# Patient Record
Sex: Female | Born: 1970 | Race: White | Hispanic: No | Marital: Married | Smoking: Never smoker
Health system: Southern US, Community
[De-identification: ages and names within clinical notes are randomized; demographics above are authoritative.]

## PROBLEM LIST (undated history)

## (undated) DIAGNOSIS — D649 Anemia, unspecified: Secondary | ICD-10-CM

## (undated) DIAGNOSIS — N393 Stress incontinence (female) (male): Secondary | ICD-10-CM

## (undated) DIAGNOSIS — M8580 Other specified disorders of bone density and structure, unspecified site: Secondary | ICD-10-CM

## (undated) DIAGNOSIS — I8390 Asymptomatic varicose veins of unspecified lower extremity: Secondary | ICD-10-CM

## (undated) HISTORY — DX: Stress incontinence (female) (male): N39.3

## (undated) HISTORY — DX: Asymptomatic varicose veins of unspecified lower extremity: I83.90

## (undated) HISTORY — PX: APPENDECTOMY: SHX54

## (undated) HISTORY — DX: Anemia, unspecified: D64.9

## (undated) HISTORY — DX: Other specified disorders of bone density and structure, unspecified site: M85.80

---

## 1997-12-24 ENCOUNTER — Ambulatory Visit (HOSPITAL_COMMUNITY): Admission: RE | Admit: 1997-12-24 | Discharge: 1997-12-24 | Payer: Self-pay | Admitting: Obstetrics and Gynecology

## 1998-01-04 ENCOUNTER — Ambulatory Visit (HOSPITAL_COMMUNITY): Admission: RE | Admit: 1998-01-04 | Discharge: 1998-01-04 | Payer: Self-pay | Admitting: Obstetrics and Gynecology

## 1998-08-28 ENCOUNTER — Inpatient Hospital Stay (HOSPITAL_COMMUNITY): Admission: AD | Admit: 1998-08-28 | Discharge: 1998-08-28 | Payer: Self-pay | Admitting: Obstetrics and Gynecology

## 1998-08-29 ENCOUNTER — Inpatient Hospital Stay (HOSPITAL_COMMUNITY): Admission: AD | Admit: 1998-08-29 | Discharge: 1998-08-31 | Payer: Self-pay | Admitting: Obstetrics and Gynecology

## 1998-08-31 ENCOUNTER — Encounter (HOSPITAL_COMMUNITY): Admission: RE | Admit: 1998-08-31 | Discharge: 1998-11-29 | Payer: Self-pay | Admitting: Obstetrics and Gynecology

## 1998-10-03 ENCOUNTER — Other Ambulatory Visit: Admission: RE | Admit: 1998-10-03 | Discharge: 1998-10-03 | Payer: Self-pay | Admitting: Obstetrics and Gynecology

## 1999-08-09 ENCOUNTER — Other Ambulatory Visit: Admission: RE | Admit: 1999-08-09 | Discharge: 1999-08-09 | Payer: Self-pay | Admitting: Obstetrics and Gynecology

## 2000-03-12 ENCOUNTER — Inpatient Hospital Stay (HOSPITAL_COMMUNITY): Admission: AD | Admit: 2000-03-12 | Discharge: 2000-03-15 | Payer: Self-pay | Admitting: Obstetrics and Gynecology

## 2000-03-12 ENCOUNTER — Encounter (INDEPENDENT_AMBULATORY_CARE_PROVIDER_SITE_OTHER): Payer: Self-pay

## 2000-04-24 ENCOUNTER — Other Ambulatory Visit: Admission: RE | Admit: 2000-04-24 | Discharge: 2000-04-24 | Payer: Self-pay | Admitting: Obstetrics and Gynecology

## 2001-06-16 ENCOUNTER — Other Ambulatory Visit: Admission: RE | Admit: 2001-06-16 | Discharge: 2001-06-16 | Payer: Self-pay | Admitting: Obstetrics and Gynecology

## 2001-08-02 ENCOUNTER — Encounter: Payer: Self-pay | Admitting: Obstetrics and Gynecology

## 2001-08-02 ENCOUNTER — Inpatient Hospital Stay (HOSPITAL_COMMUNITY): Admission: AD | Admit: 2001-08-02 | Discharge: 2001-08-02 | Payer: Self-pay | Admitting: Obstetrics and Gynecology

## 2001-12-27 ENCOUNTER — Inpatient Hospital Stay (HOSPITAL_COMMUNITY): Admission: AD | Admit: 2001-12-27 | Discharge: 2001-12-30 | Payer: Self-pay | Admitting: Obstetrics and Gynecology

## 2001-12-27 ENCOUNTER — Encounter: Payer: Self-pay | Admitting: Obstetrics and Gynecology

## 2001-12-27 ENCOUNTER — Encounter (INDEPENDENT_AMBULATORY_CARE_PROVIDER_SITE_OTHER): Payer: Self-pay

## 2002-01-26 ENCOUNTER — Other Ambulatory Visit: Admission: RE | Admit: 2002-01-26 | Discharge: 2002-01-26 | Payer: Self-pay | Admitting: Obstetrics and Gynecology

## 2003-03-02 ENCOUNTER — Other Ambulatory Visit: Admission: RE | Admit: 2003-03-02 | Discharge: 2003-03-02 | Payer: Self-pay | Admitting: Obstetrics and Gynecology

## 2003-07-03 HISTORY — PX: CHOLECYSTECTOMY: SHX55

## 2003-10-06 ENCOUNTER — Inpatient Hospital Stay (HOSPITAL_COMMUNITY): Admission: EM | Admit: 2003-10-06 | Discharge: 2003-10-08 | Payer: Self-pay | Admitting: Emergency Medicine

## 2003-10-06 ENCOUNTER — Encounter (INDEPENDENT_AMBULATORY_CARE_PROVIDER_SITE_OTHER): Payer: Self-pay | Admitting: Cardiology

## 2004-01-18 ENCOUNTER — Ambulatory Visit (HOSPITAL_COMMUNITY): Admission: RE | Admit: 2004-01-18 | Discharge: 2004-01-18 | Payer: Self-pay | Admitting: Oncology

## 2004-03-17 ENCOUNTER — Encounter (INDEPENDENT_AMBULATORY_CARE_PROVIDER_SITE_OTHER): Payer: Self-pay | Admitting: Specialist

## 2004-03-17 ENCOUNTER — Ambulatory Visit (HOSPITAL_COMMUNITY): Admission: RE | Admit: 2004-03-17 | Discharge: 2004-03-17 | Payer: Self-pay | Admitting: General Surgery

## 2004-03-23 ENCOUNTER — Other Ambulatory Visit: Admission: RE | Admit: 2004-03-23 | Discharge: 2004-03-23 | Payer: Self-pay | Admitting: Obstetrics and Gynecology

## 2004-06-26 ENCOUNTER — Inpatient Hospital Stay (HOSPITAL_COMMUNITY): Admission: AD | Admit: 2004-06-26 | Discharge: 2004-06-26 | Payer: Self-pay | Admitting: Obstetrics and Gynecology

## 2004-07-20 ENCOUNTER — Ambulatory Visit: Payer: Self-pay | Admitting: Oncology

## 2004-09-27 ENCOUNTER — Ambulatory Visit: Payer: Self-pay | Admitting: Oncology

## 2004-11-15 ENCOUNTER — Inpatient Hospital Stay (HOSPITAL_COMMUNITY): Admission: AD | Admit: 2004-11-15 | Discharge: 2004-11-15 | Payer: Self-pay | Admitting: Obstetrics and Gynecology

## 2005-01-05 ENCOUNTER — Inpatient Hospital Stay (HOSPITAL_COMMUNITY): Admission: RE | Admit: 2005-01-05 | Discharge: 2005-01-07 | Payer: Self-pay | Admitting: Obstetrics and Gynecology

## 2005-02-09 ENCOUNTER — Other Ambulatory Visit: Admission: RE | Admit: 2005-02-09 | Discharge: 2005-02-09 | Payer: Self-pay | Admitting: Obstetrics and Gynecology

## 2005-11-05 ENCOUNTER — Ambulatory Visit: Payer: Self-pay | Admitting: Internal Medicine

## 2007-03-26 ENCOUNTER — Ambulatory Visit: Payer: Self-pay | Admitting: Vascular Surgery

## 2008-11-22 ENCOUNTER — Encounter: Admission: RE | Admit: 2008-11-22 | Discharge: 2008-11-22 | Payer: Self-pay | Admitting: Chiropractic Medicine

## 2010-07-23 ENCOUNTER — Encounter: Payer: Self-pay | Admitting: Family Medicine

## 2010-11-14 NOTE — Consult Note (Signed)
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  She reports that this has been progressive over the past  six years and was first with pregnancies.  She has four children.  She  reports pain and aching particularly in her calf with prolonged standing  and reports a tight sensation over this area as well.  She does not have  any history of severe thrombophlebitis or bleeding from these.  She does  not have any history of deep venous thrombosis.  She does report this  disrupts her daily activities with exercise and also childcare and  housework.   Her medical history is negative for diabetes, cancer, cardiac disease.  She does report sciatic nerve discomfort in her left leg.  She does have  prior C-sections x2 and also had cholecystectomy in the past.   PHYSICAL EXAMINATION:  On physical exam she is a well-developed, well-  nourished white female appearing stated age of 39.  Blood pressure is  114/66, pulse 66, respirations 16.  Her radial pulses are 2+  bilaterally.  She has 2+ dorsalis pedis pulses bilaterally.  She does  not have any evidence of saphenous or tributary varicosities in the  right leg.  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  She underwent hand-held duplex  by me as a screening exam, and this did reveal gross reflux in her  saphenous vein.   I had a long discussion with [name] and her husband present.  I  explained that, with her severe venous hypertension, I would suspect  that we could obtain very good relief of her discomfort with treatment  with laser ablation and stab phlebectomy of her tributary varicosities.  I did discuss conservative treatment with  compression garments as well.  She understands that this is not a limb- or life-threatening condition  and it is progressive and is certainly causing her significant  discomfort currently.  She will continue her discussion and proceed with  treatment at her convenience.   Larina Earthly, M.D.  Electronically Signed   TFE/MEDQ  D:  03/26/2007  T:  03/27/2007  Job:  [id number]   cc:   Chales Salmon. Abigail Miyamoto, M.D.

## 2010-11-17 NOTE — Op Note (Signed)
NAMECYNDAL, Karen [name] NO.:  [id number]   MEDICAL RECORD NO.:  [id number]          PATIENT TYPE:  INP   LOCATION:  9199                          FACILITY:  [facility]   PHYSICIAN:  Juluis Mire, M.D.   DATE OF BIRTH:  10-Aug-1970   DATE OF PROCEDURE:  01/05/2005  DATE OF DISCHARGE:                                 OPERATIVE REPORT   PREOPERATIVE DIAGNOSES:  1.  Intrauterine pregnancy at term with prior cesarean sections, desires      repeat.  2.  Thrombocytopenia.   POSTOPERATIVE DIAGNOSES:  1.  Intrauterine pregnancy at term with prior cesarean sections, desires      repeat.  2.  Thrombocytopenia.   PROCEDURE:  Low transverse cesarean section.   SURGEON:  Juluis Mire, M.D.   ANESTHESIA:  Spinal.   ESTIMATED BLOOD LOSS:  400 cc.   PACKS AND DRAINS:  None.   INTRAOPERATIVE BLOOD REPLACED:  None.   COMPLICATIONS:  None.   INDICATIONS FOR PROCEDURE:  As dictated in the history and physical.   DESCRIPTION OF PROCEDURE:  The patient was taken to the OR and placed in the  supine position with a left lateral tilt.  After a satisfactory level of  spinal anesthesia was obtained, the abdomen was prepped out with Betadine  and draped as a sterile field.   Her prior low transverse skin incision was identified and excised.  The  incision was extended through the subcutaneous tissue.  The fascia was  entered sharply, and the incision in the fascia was extended laterally.  The  fascia was taken off of the muscles superiorly and inferiorly.  The rectus  muscles were separated in the midline.  The peritoneum was entered sharply,  and the incision in the patient was extended both superiorly and inferiorly.  A low transverse bladder flap was developed.  The low transverse uterine  incision was begun with the knife and extended laterally using manual  traction.  The amniotic fluid was clear.  The infant presented in the vertex  presentation, with delivery with fundal  pressure and the vacuum extractor.  The infant was a viable female who weighed 6 pounds 7 ounces.  Apgars were  9/9.  Umbilical artery pH was 7.33.  The placenta was delivered manually.  The uterus was closed with interlocking sutures of 0 chromic, using a two  layered closure technique.  Areas of bleeding were brought under control  with figure-of-eights of 0 chromic.  We had good hemostasis and clear urine  output.  The tubes and ovaries were unremarkable.  The muscles were  reapproximated with running suture of 3-0 Vicryl.  The fascia was closed  with running suture of 0 PDS.  The skin was closed with staples and Steri-  Strips.  Sponge, needle, and instrument counts were reported as correct by  the circulating nurse x2.  Foley catheter remained clear at the time of  closure.   The patient tolerated the procedure well and was returned to the recovery  room in good condition.       JSM/MEDQ  D:  01/05/2005  T:  01/05/2005  Job:  [id number]

## 2010-11-17 NOTE — Consult Note (Signed)
[name], [name] NO.:  [id number]   MEDICAL RECORD NO.:  [id number]                   PATIENT TYPE:  INP   LOCATION:  2024                                 FACILITY:  [facility]   PHYSICIAN:  Drue Second, MD                    DATE OF BIRTH:  Sep 10, 1970   DATE OF CONSULTATION:  10/07/2003  DATE OF DISCHARGE:  10/08/2003                                   CONSULTATION   REASON FOR CONSULTATION:  Pancytopenia.   REFERRING PHYSICIAN:  Armanda Magic, M.D.   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 Welton Flakes.  The patient was asked for a followup consultation,  regarding her blood counts.  She was originally evaluated by Dr. Philis Kendall in  2003, she had presented with a low platelet count during pregnancy, ranging  between 120,000 and 150,000.  She delivered successfully through cesarean  section without any complications.  In 2004, the patient was seen by Dr.  Welton Flakes with a platelet count of 130,000, and was followed up since that time  every four months.  No therapy was needed.  She was otherwise in good health  until 10 days ago when she and her children developed a viral versus  bacterial upper respiratory tract infection.  She never appeared to recover  fully, and presented to her primary care physician with cough and nausea.  At that time, she was given Levaquin in hopes of resolving her symptoms.  Unfortunately, her heart rate was found to be in the 30's and she had to be  admitted to [facility] for further evaluation.  Labs on admission  revealed mild pancytopenia with a white count of 2.7 and a platelet count of  82,000.  The chest x-ray was normal.  Two-dimensional echocardiogram was  essentially unremarkable.  Because she was having symptomatic bradycardia,  Dr. [name] contemplated a pacemaker placement suspecting sinus  syndrome  versus superimposed infection, i.e., Legionella.  Her platelet count today  is 77,000.  Although she may not require a pacemaker placement after all, if  she were to undergo any procedure, she would be able to tolerate it with  this count.  We will continue to follow.   PAST MEDICAL HISTORY:  1. 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  2. Symptomatic bradycardia, unknown etiology.  3. Recent history of viral versus bacterial upper respiratory tract     infection.   PAST SURGICAL HISTORY:  Status post cesarean section.   ALLERGIES:  PENICILLIN.   CURRENT MEDICATIONS:  1. Ativan 0.1 mg q.6h p.r.n.  2. Maalox p.r.n.  3. Tylenol p.r.n.   REVIEW OF SYSTEMS:  The patient is overall stable, she denies any active  bleeding, no bleeding in the gums, no easy  bruisability.  Other than some  vague headaches and occasional night sweats, since the beginning of her  upper respiratory tract infection, she denies any abdominal pain, nausea, or  vomiting.  NO chest pain.  No palpitations.  No dysuria or gross hematuria.  No hematochezia.  NO calf tenderness or dysesthesia.  No peripheral edema.   FAMILY HISTORY:  Remarkable for a grandfather with a history of leukemia and  a grandmother with a history of breast cancer.  Otherwise, her mother and  father are in essentially good health.   SOCIAL HISTORY:  The patient is married, her husband Baldo Ash is her main  caretaker, telephone number [phone].  The patient has three children in  good health.  She is a Fut[employer] trader.  She denies any tobacco or alcohol intake.  The patient exercises vigorously.   PHYSICAL EXAMINATION:  GENERAL:  She is a well-developed, well-nourished, 40-  year-old white female in no acute distress, alert and oriented x3.  VITAL SIGNS:  Blood pressure 105/56, pulse 31, respirations 18, temperature  97.1, weight 130.8, height 5 foot 6 inches, pulse oximetry 93% on room air.  HEENT:  Normocephalic and atraumatic.  PERRLA.  Oral mucosa  without thrush.  The mucosa is moist.  No gum bleed is visible.  NECK:  Supple, no JVD.  NO cervical or supraclavicular masses.  CHEST:  Symmetrical on inspiration.  LUNGS:  Clear to auscultation.  HEART:  Regular rate and rhythm.  Although, at a slow rate, bradycardic.  No  murmurs, rubs, or gallops.  Occasionally, there is a pulse.  ABDOMEN:  Soft, mildly tender in the right upper quadrant, with a palpable  liver lobe 3 cm below the costal border.  No other masses palpable.  No  splenomegaly.  Bowel sounds x4.  EXTREMITIES:  No cyanosis, clubbing, or edema.  SKIN:  Without visible areas of bleeding.  NEUROLOGY:  Nonfocal.   LABORATORY DATA:  Hemoglobin 11.6, hematocrit 33.6, white count 3.1,  platelets 77,000.  Her platelets on October 06, 2003, were 82,000.  Neutrophils  1.3, MCV 88.7, PT 12.9, PTT 32, INR 1.0, D-dimer 1.83, sodium 143, potassium  4.0, BUN 9, creatinine 0.8, glucose 102, total bilirubin 0.4, alkaline  phosphatase 45, AST 42, ALT 48, total protein 7.1, albumin 3.9, calcium 8.1,  TSH 1.949.   Two-dimensional echocardiogram on October 06, 2003, demonstrates ejection  fraction of 50 to 55%, with right ventricular size in the upper limits of  normal, bowing mitral valve without evidence of mitral valve prolapse.   Chest x-ray without acute changes.  Scoliosis.   Abdominal CT with contrast is pending.   ASSESSMENT:  1. Mild pancytopenia possibly secondary to viral illness, questionable to     Levaquin use.  Will continue to monitor.  The pancytopenia is likely due     to this underlying illness and as the patient recovers, her count should     increase.  2. Symptomatic bradycardia as per EP.  3. Mild elevation of liver function tests with hepatomegaly of unknown     etiology, as per GI.  They are to rule out viral hepatitis, and     underlying liver disease before discharging the patient.  A CT of the     abdomen, chest, and    pelvis is pending.  Viral hepatic serology  is pending.  CMV EBV serology     ANA, alpha 1 and alpha 2, and AMA are pending.  Serum plasma pending.   Thank you very much  for allowing [place] to participate in the care of the  patient.  Will continue to follow.     Marlowe Kays, P.A.                        Drue Second, MD    SW/MEDQ  D:  10/18/2003  T:  10/18/2003  Job:  [id number]   cc:   Juluis Mire, M.D.  [address] [place]  [place] [address]  Fax: [phone]   Doylene Canning. [name], M.D.

## 2010-11-17 NOTE — Op Note (Signed)
NAMEDOMINGUE, COLTRAIN NO.:  [id number]   MEDICAL RECORD NO.:  [id number]                   PATIENT TYPE:  OBV   LOCATION:  0098                                 FACILITY:  [facility]   PHYSICIAN:  Timothy E. Earlene Plater, M.D.              DATE OF BIRTH:  07-Feb-1971   DATE OF PROCEDURE:  03/17/2004  DATE OF DISCHARGE:                                 OPERATIVE REPORT   PREOPERATIVE DIAGNOSIS:  Cholelithiasis.   POSTOPERATIVE DIAGNOSIS:  Cholelithiasis.  Question of mass of the liver.   OPERATIVE PROCEDURE:  Laparoscopy, laparoscopic cholecystectomy with  cholangiogram and biopsy of liver lesion.   SURGEON:  Timothy E. Earlene Plater, M.D.   ANESTHESIA:  General.   Karen [name] has been worked up extensively over the past six months by primary  care cardiology and hematology for various and sundry abnormalities.  During  that workup, gallstones were seen, both on CT scan and on ultrasound.  Though I could not clearly attribute any of her symptoms to the gallstones,  because of their presence and the fact that she is planning a pregnancy in  the near future, she wants to have this surgery.  It has been carefully  discussed with her husband and her on two separate occasions.  They agree  and understand that a laparoscopic cholecystectomy and cholangiogram is in  order.  Her laboratory data is essentially normal at this time, and she is  asymptomatic.  She is seen and identified and the permit signed.   The patient came to the operating room and was placed supine.  General  endotracheal anesthesia administered.  The abdomen was prepped and draped in  the usual fashion.  Marcaine 0.25% with epinephrine was used prior to each  incision.  An infraumbilical incision made.  The fascia identified and  opened vertically.  The peritoneum entered without complications.  Hasson  catheter placed and tied in place with #1 Vicryl and the abdomen  insufflated.  A 10 mm trocar placed in the  mid epigastrium and two 5 mm  trocars in the right lower quadrant.  The gallbladder appeared indolent.  There was an adenomatous-appearing lesion on the edge of the left lateral  lobe of the liver.  Careful peritoneoscopy was carried out, showing the  colon relatively full of stool, somewhat stiff in its appearance.  The  pelvis was clear, and no other abnormalities were noted.   Attention was turned to the gallbladder.  It was grasped and placed on  tension.  Careful dissection at the base of the gallbladder revealed a  normal-appearing cystic duct entering the gallbladder.  Behind that, two  branches of the cystic artery.  The duct was dissected out.  A complete  window made.  No other structures noted.  A clip placed on the gallbladder  side of the cystic duct.  The cystic duct opened.  A catheter passed  percutaneously and placed into the cystic duct remnant.  Using real-time  fluoroscopy and Hypaque dye, a cholangiogram was carried out, showing rapid  filling of the biliary tree and emptying of dye into the duodenum.  No  abnormalities were seen.  The clip catheter removed.  The stump on the  cystic duct triply clipped.  The duct fully divided.  Each branch of the  artery doubly clipped and divided.  The gallbladder is removed from the  gallbladder bed without incident or complication.  The gallbladder bed was  carefully visualized, and any areas noted were cauterized.  There was no  bleeding.  The irrigant was clear.   I thought it was important and necessary to biopsy this apparent adenoma of  the liver, so then switching ports and approaching that from the right lower  quadrant, I was able to do two pinch biopsies of this lesion and place them  in formula and send to the lab.  The area did not bleed.  It was solid, and  I cauterized the biopsy sites.  There were no complications.  All sites were  checked, and there were no complications.  All CO2 irrigation, instruments,  and  trocars are removed.  All skin incisions checked.  I did elect to close  the fascia on the mid epigastric incision as well as with #1 Vicryl, and the  skin incision is closed with 4-0 Monocryl.  All counts are correct.  Steri-  Strips and dry sterile dressing is applied.  She tolerated it well and was  awakened and taken to the recovery room in good condition.                                               Timothy E. Earlene Plater, M.D.    TED/MEDQ  D:  03/17/2004  T:  03/17/2004  Job:  [id number]   cc:   Chales Salmon. Abigail Miyamoto, M.D.  [address]  [place]  [place] [address]  Fax: [phone]   Drue Second, MD  Fax: [phone]

## 2010-11-17 NOTE — Op Note (Signed)
[facility]  Patient:    Karen [name], Karen [name] Visit Number: [id number] MRN: [id number]          Service Type: OBS Location: 910A [id number] [id number] Attending Physician:  Soledad Gerlach Dictated by:   Juluis Mire, M.D. Proc. Date: 12/27/01 Admit Date:  12/27/2001                             Operative Report  PREOPERATIVE DIAGNOSIS:       Intrauterine pregnancy at term with persistent fetal bradycardia.  POSTOPERATIVE DIAGNOSIS:      Intrauterine pregnancy at term with persistent fetal bradycardia.  OPERATION:                    Emergent low transverse cesarean section.  SURGEON:                      Juluis Mire, M.D.  ASSISTANT:  ANESTHESIA:                   Spinal.  ESTIMATED BLOOD LOSS:         900 cc.  PACKS AND DRAINS:             None.  COMPLICATIONS:                None.  INDICATIONS:                  A 40 year old, gravida 4, para 2, abortus 1, married white female presents at 39-3/7 weeks with spontaneous onset of labor. The patient was noted to be 4 cm.  Artificial rupture of membranes revealed initially clear fluid.  Eventually some slight meconium stained fluid was noted.  It was very thin.  The patient became a rim dilated and had subsequent fetal bradycardia into the 50s.  This did not resolve with position changes, oxygen, or scalp stimulation.  No cord prolapse was noted.  The patient was emergently taken back to the OR.  DESCRIPTION OF PROCEDURE:     In the OR, the patients fetal heart rate still remained in the 60s to 70s with no signs of recovery and emergent cesarean section was undertaken.  The spinal was actually being slipped in as we were placing the patient on the monitor.  This went definitely faster with spinal than it would have with general anesthesia at this point.  The patient was taken to the OR and placed in the supine position with a left lateral tilt.  The abdomen was prepped with Betadine and draped as  a sterile field.  A low transverse skin incision was made with the knife and carried through the subcutaneous tissue.  The fascia was entered sharply and spread laterally manually.  The fascia was then pulled anteriorly and posteriorly using manual traction.  The rectus muscles were separated at the same time. The peritoneum was entered using manual traction.  The low transverse bladder flap was developed.  A low transverse incision was begun with the knife and extended laterally using manual traction.  2+ meconium stained fluid was noted. The infant was delivered with elevation of the head and fundal pressure, and passed off immediately to the pediatric team.  I did not see at this point in time a knot in the cord or any sign of tight nuchal cord.  The exact etiology of the bradycardia was unknown.  The placenta was delivered manually  and appeared to be normal, it was sent for pathological review. The infant was a viable female who weighed 7 pounds 7 ounces, Apgars were 2, 6, and 9.  The pH was 6.91, pCO2 123, bicarb 23.8, and base excess was -14.3.  At this point in time, the uterus was closed with a running locking suture of 0 chromic using a two-layer closure technique.  There was some bleeding from the right lower edge of the lower uterine segment and we had to bring this under control with some 3-0 Vicryl on SH needle.  This was done with a couple of figure-of-eight sutures.  This was not out into the broad ligament, but close to the uterus and there was no concern as I believe this was well above the ureter.  Tubes and ovaries were unremarkable.  We thoroughly irrigated the pelvic cavity numerous times due to the emergent nature of the cesarean section.  Copious amount of fluid was used.  We could see the appendix and it was normal.  There were no signs of active bleeding.  Urine output remained clear and adequate.  The peritoneum and muscle closed with a running suture of 3-0 Vicryl,  the fascia closed with a running suture of 0 PDS, and the skin was closed with staples and Steri-Strips.  An x-ray was obtained due to the inability to account for the emergent cesarean section and the x-ray was clear.  The patient was taken out of the dorsal lithotomy position and once alert, transferred to the recovery room in good condition. Dictated by:   Juluis Mire, M.D. Attending Physician:  Soledad Gerlach DD:  12/27/01 TD:  12/29/01 Job: [id number]

## 2010-11-17 NOTE — Discharge Summary (Signed)
Karen [name], ATEN NO.:  [id number]   MEDICAL RECORD NO.:  [id number]                   PATIENT TYPE:  NP   LOCATION:  9142                                 FACILITY:  [facility]   PHYSICIAN:  Donnella Bi, M.D.                     DATE OF BIRTH:  Jan 19, 1971   DATE OF ADMISSION:  12/27/2001  DATE OF DISCHARGE:  12/30/2001                                 DISCHARGE SUMMARY   ADMITTING DIAGNOSES:  1. Intrauterine pregnancy at term.  2. Persistent fetal bradycardia.   DISCHARGE DIAGNOSES:  1. Status post low transverse cesarean section.  2. Viable female infant.   PROCEDURE:  Emergent low transverse cesarean section.   REASON FOR ADMISSION:  Please see written H&P.   HOSPITAL COURSE:  The patient presents to the hospital at 39-3/[redacted] weeks  gestation with spontaneous onset of labor.  The patient was noted to be 4 cm  dilated.  Artifical rupture of membranes revealed initially clear fluid.  Eventually some slight meconium-stained  was noted.  The patient became rim  dilated and had subsequent fetal bradycardia into the 50s.  This did not  resolve with positional changes, oxygen or scalp stimulation.  No cord  prolapse was noted.  Decision was made to proceed with an emergent low  transverse cesarean section.  The patient was taken to the operating room  where spinal anesthesia was administered without difficulty.  A low  transverse incision was made with the delivery of a viable female infant  weighing 7 pounds 7 ounces, Apgars at 2 at one minute, 6 at five minutes, 9  and 10 minutes.  The umbilical cord pH was 6.91.  The patient tolerated the  procedure well and was taken to the recovery room in good condition.  Later  that evening, the patient complained of bilateral shoulder and neck pain.  She denies shortness of breath.  Lungs were clear to auscultation.  Abdomen  was soft.  Abdominal dressing was clean, dry and intact.  Fundus was firm.  Labs reveals  hemoglobin 9.3, WBC count 10.6 and platelets 103,000.   On postoperative day one, the patient was hungry.  Abdomen was soft.  Bowel  sounds were heard x 4.  Shoulder pain was resolved.  Labs remained stable  with a hemoglobin of 9.9, platelets of 123,000 and WBC count of 11.4.   On postoperative day two, incision was clean, dry and intact.  The patient  had good return of bowel function and was tolerating a regular diet without  complaints of nausea and vomiting.  The patient was voiding without  difficulty.  She was ambulating without assistance.   On postoperative day three, incision was clean, dry and intact.  Staples  were removed and patient was discharged home.   CONDITION ON DISCHARGE:  Good.   DIET:  Regular as tolerated.  ACTIVITY:  No heavy lifting.  No driving x 2 weeks.  No vaginal entry.   FOLLOWUP:  The patient is to follow up in the office in 1-2 weeks for an  incision check.  She is to call for temperature greater than 100 degrees,  persistent nausea and vomiting, heavy vaginal bleeding and/or redness or  drainage from her incision site.   DISCHARGE MEDICATIONS:  Percocet 5/325, #50, 1-2 every 4-6 hours p.r.n.  pain; Motrin 800 mg, #30, 1 every 8 hours p.r.n. and prenatal vitamins 1  p.o. daily.      Judith Blonder, N.P.                     Donnella Bi, M.D.    CGC/MEDQ  D:  01/28/2002  T:  02/03/2002  Job:  [id number]

## 2010-11-17 NOTE — H&P (Signed)
Karen [name], CUCCIA NO.:  [id number]   MEDICAL RECORD NO.:  [id number]          PATIENT TYPE:  INP   LOCATION:  NA                            FACILITY:  [facility]   PHYSICIAN:  Juluis Mire, M.D.   DATE OF BIRTH:  05-22-1971   DATE OF ADMISSION:  01/05/2005  DATE OF DISCHARGE:                                HISTORY & PHYSICAL   The patient is a 40 year old gravida 5, para 3, abortus 1, married white  female, estimated date of confinement July 12, giving her an estimated  gestational age of 39+ weeks, consistent with initial exam and prior  ultrasound.  The patient presents for repeat cesarean section.   The patient's last pregnancy was complicated by primary cesarean section due  to fetal distress.  The patient had been offered a trial of labor, which is  declined, wishing to proceed with repeat cesarean section, for which she is  admitted at the present time.  Her pregnancy has also been complicated by  thrombocytopenia.  She has had a longstanding history of this.  She is  followed by hematology/oncologists in town.  Her last platelet count was  noted to be 93,000, which has been relatively stable.  The baby has also had  some renal pelviectasis involving the right renal kidney.  Amniotic fluid  has been normal.  Because of the patient's fetal distress with the last  labor, we have watched her with serial ultrasounds and nonstress testing,  all of which have been very reassuring.   In terms of allergies, she is allergic to PENICILLIN.   MEDICATIONS:  Prenatal vitamins.   For past medical history, family history and social history, please see  prenatal records.   REVIEW OF SYSTEMS:  Noncontributory.   PHYSICAL EXAMINATION:  VITAL SIGNS:  The patient is afebrile with stable  vital signs.  HEENT:  Patient normocephalic.  Pupils equal, round, and reactive to light  and accommodation, extraocular movements were intact.  Sclerae and  conjunctivae were clear.   Oropharynx is clear.  NECK:  Without thyromegaly.  BREASTS:  Glandular but no discrete masses.  CHEST:  Lungs clear.  CARDIAC:  Regular rhythm and rate with a grade 2/6 systolic ejection murmur.  No clicks or gallops.  ABDOMEN:  Gravid uterus consistent with dates.  A well-healed low transverse  incision.  PELVIC:  Cervix long and closed.  EXTREMITIES:  Trace edema.  NEUROLOGIC:  Grossly within normal limits.  Deep tendon reflexes 2+ and no  clonus.   IMPRESSION:  1.  Primary cesarean section, desirous of repeat.  2.  Thrombocytopenia.   PLAN:  The patient will undergo repeat cesarean section.  The risks of  surgery have been discussed, including the risk of infection; the risk of  hemorrhage that could require transfusion with the risk of AIDS or  hepatitis; risk of  injury to adjacent organs including bladder, bowel or ureters that could  require further exploratory surgery; the risk of deep venous thrombosis and  pulmonary embolus.  The patient expressed an understanding of indications  and risks.  JSM/MEDQ  D:  01/05/2005  T:  01/05/2005  Job:  [id number]

## 2010-11-17 NOTE — Consult Note (Signed)
Karen [name], LAMOS NO.:  [id number]   MEDICAL RECORD NO.:  [id number]                   PATIENT TYPE:  INP   LOCATION:  1827                                 FACILITY:  [facility]   PHYSICIAN:  Doylene Canning. [name], M.D.               DATE OF BIRTH:  September 18, 1970   DATE OF CONSULTATION:  10/06/2003  DATE OF DISCHARGE:                                   CONSULTATION   REFERRING PHYSICIAN:  Armanda Magic, M.D.   REASON FOR CONSULTATION:  Evaluation of symptomatic bradycardia.   HISTORY OF PRESENT ILLNESS:  The patient is a very pleasant 40 year old  young woman who has been previously healthy.  She has three healthy young  children, the last of whom was delivered with stat C-section but with no  untoward consequences.  Approximately one week ago, she found that her  children were all ill with diarrhea, nausea, and fevers with some upper  respiratory tract symptoms and she subsequently developed the exact same  symptoms.  She has been seen and found to have what was thought to be a  upper respiratory tract infection with yellow productive cough and sputum  formation.  In addition, she had continued nausea and fatigue with joint  aches.  She was initially put on promethazine with codeine and Levaquin.  She was seen by her primary doctor today and found to have bradycardia and  hypotension and was admitted for additional evaluation.  She was found to  have borderline cardiac enzymes and slightly elevated liver function tests.  In addition, the patient had fairly significant thrombocytopenia and  neutropenia.  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  The patient denies frank syncope.  She denies  palpitations.  Her initial EKG has demonstrated sinus bradycardia with heart  rates in the mid 30s.   PAST MEDICAL HISTORY:  As previously noted.   FAMILY HISTORY:  Notable for a mother with hypertension.  She has a  grandmother with breast cancer, her grandfather had leukemia.  She has mild  scoliosis.   SOCIAL HISTORY:  The patient denies tobacco or ethanol use or use of illicit  drugs.  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   ALLERGIES:  She gives history of allergy to PENICILLIN.   MEDICATIONS:  She is on no chronic medications, however, she has been on  promethazine with codeine, Levaquin and a nutritional supplement (Airborne).   REVIEW OF SYMPTOMS:  Notable for fevers and chills, nausea and vomiting,  diarrhea, and discomfort in the right upper quadrant region.  She does have  weakness and fatigue and dizziness and very mild chest discomfort, though  she denies frank chest pain.  The rest of her review of systems was  negative.  She denies any recent vision or hearing problems.  She denies any  arthritic complaints.  She did have myalgias during the early part of her  recent infectious illness.   PHYSICAL EXAMINATION:  GENERAL APPEARANCE:  She is a pleasant, somewhat ill-  appearing young woman in no distress.  VITAL SIGNS:  Blood pressure was 95/50, pulse 35 and regular, respiratory  rate 18, temperature 97.7.  HEENT:  Normocephalic and atraumatic.  Pupils equal and round.  Oropharynx  moist.  Sclerae are anicteric.  NECK:  No jugular venous distension, no thyromegaly.  Trachea midline.  LUNGS:  Clear to auscultation bilaterally.  There were no wheezing, rhonchi  or rales appreciated.  CARDIOVASCULAR:  Regular bradycardia with normal S1 and S2.  ABDOMEN:  Scaphoid, soft, nontender, nondistended.  There was question of  hepatomegaly and the patient did have some very mild costophrenic angle  tenderness.  She did not have any flank tenderness.  EXTREMITIES:  No clubbing, cyanosis, or edema.  The pulses were 2+ and  symmetric.   EKG demonstrates sinus bradycardia.   LABORATORY DATA:  Platelet count 82,000, white count 2.7 with normal  hemoglobin.  Her INR was  normal.  Her liver panel demonstrated mild  transaminasemia with LFTs being slightly above the upper limits of normal.  Her bilirubin was normal.   IMPRESSION:  1. Symptomatic bradycardia.  2. Idiopathic thrombocytopenia.  3. Recent viral illness probably influenza.  4. Neutropenia.   DISCUSSION:  The etiology of the patient's sinus bradycardia is unknown but  I think not likely to be primary sinus node dysfunction.  Her normal LV  function rules out significant viral cardiomyopathy although I suppose she  could have primary sinus node involvement resulting in her bradycardia.  As  I recall, some infectious agents can cause relative bradycardia (question  Legionella).  Her elevation in her liver function tests and her neutropenia  and thrombocytopenia certainly suggest a multisystem problem.  I also  suppose an unusual vasculitis could cause these symptoms as well.  I have  recommended that the patient be watched on telemetry.  As her right upper  quadrant is somewhat tender on examination, I think CT scanning of the  abdomen versus ultrasound would be consideration as well as viral and  Legionella serologies.  For now, I would not recommend permanent pacemaker  insertion, although if her symptoms do not improve, that would certainly be  a consideration in the future.                                               Doylene Canning. [name], M.D.    GWT/MEDQ  D:  10/06/2003  T:  10/08/2003  Job:  [id number]   cc:   Chales Salmon. Abigail Miyamoto, M.D.  [address]  [place]  [place] [address]  Fax: [phone]

## 2010-11-17 NOTE — Discharge Summary (Signed)
Karen [name], MARROCCO NO.:  [id number]   MEDICAL RECORD NO.:  [id number]                   PATIENT TYPE:  INP   LOCATION:  2024                                 FACILITY:  [facility]   PHYSICIAN:  Doylene Canning. [name], M.D.               DATE OF BIRTH:  1970/10/27   DATE OF ADMISSION:  10/06/2003  DATE OF DISCHARGE:                                 DISCHARGE SUMMARY   PRIMARY DIAGNOSES:  Bradycardia.   HISTORY:  This is a 40 year old female who has been previously healthy.  She  has 3 healthy young children, the last of whom was delivered with a stat C-  section but no untoward consequences.  Approximately 1 week ago she found  that her children were all ill with diarrhea, nausea and fevers and some  upper respiratory track symptoms.  She subsequently developed the exact same  symptoms.  She has been seen and found to have what was thought of be an  upper respiratory tract infection with yellow, productive cough and sputum.  In addition, she continued nausea and fatigue with joint aches.  Initially  put on Promethazine with Codeine and Levaquin, she was seen by her primary  doctor and found to have bradycardia, then hypotension.  She was admitted  for additional evaluation and found to have borderline cardiac enzymes and  slightly elevated liver functions.  In addition, the patient has fairly  significant thrombocytopenia and neutropenia.  She does carry a diagnosis of  ITP with chronic platelet counts in the 100 to 120.  The patient denies  frank syncope, palpitations.  Her initial EKG demonstrated sinus brady with  a heart rate in the mid 30's.   ALLERGIES:  PENICILLIN.   MEDICATIONS:  No chronic medications.   HOSPITAL COURSE:  The patient was admitted initially under Dr. Norris Cross  service and subsequently transferred to Dr. Olga Millers service.  The  patient was seen by Dr. [name] who felt the sinus bradycardia was unknown  but not likely to be  primarily sinus node dysfunction.  Her normal LV  function rule out a significant  viral cardiomyopathy, although she could  have a primary sinus node involvement resulting in her bradycardia.  Some  infectious agents can cause relative bradycardia, question is Legionella.  Elevation in liver enzymes and neutropenia, thrombocytopenia suggest multi-  system problem.  Vasculitis can also the symptoms as well.  The patient was  watched on telemetry monitor.  She also complained of right upper quadrant  tenderness.  A permanent pacer was not recommended at this time.  TSH was  1.95, D-Dimer 1.83, sodium 143, potassium 4.0, chloride is 114, CO2 is 26,  glucose is 102, BUN of 9, creatinine 0.8.  WBC 3.1, H&H 11.6 and 34, with a  platelet count of 77.  CK was 87, MB was 2.2.  The patient was also noted to  have  increased LFTs.  A GI consult was obtained with Dr. Marrion Coy.  The  patient was then scheduled for a hiatus scan and an exercise treadmill.  An  exercise treadmill was performed.  The patient walked for 12 minutes on a  Bruce protocol.  Test was stopped secondary to M.D. discretion.  Heart rate  at rest was s40 which went up to 160 with exercise.  Blood pressure 127/66,  156/62.  Recovery back to a heart rate of 50's and 60's at rest.  No  ischemic changes.  The patient had a normal response to exercise.  Resting  sinus bradycardia likely secondary to high vagal tone.  Hiatus scan was  performed to evaluate the liver and gallbladder.  CT scan of the gallbladder  showed gallstones, gallbladder well visualized with thickening with plural  gallstones in the gallbladder, no definite evidence of hepatic radicals or  common bile duct is seen.  Pancreas normal.  Small rate pleural effusion.  CT scan of the pelvis:  Free fluid in pelvis, no other definite abnormality  is seen.  The appendix is visualized and normal.  Viral screens were still  pending at the time of discharge, as well as hepatitis A,  ANA were still  pending.  Platelet count was increased to 90.  The patient underwent a  hiatus scan which was to be evaluated by Dr. Leone Payor.   DISCHARGE INSTRUCTIONS:  The patient was then discharged to home in stable  condition.  She was discharged on no medications.  Pain management was not  applicable.  Activity as tolerated.  She was instructed not to do any  strenuous activity until seen by Dr. [name].  Low fat, low cholesterol diet.  She was to see Dr. [name] on April 28 at 11 a.m. and follow with Dr. Leone Payor  April 22 at 10:15 a.m.     Chinita Pester, C.R.N.P. [facility]                 Doylene Canning. [name], M.D.    DS/MEDQ  D:  10/08/2003  T:  10/09/2003  Job:  [id number]

## 2010-11-17 NOTE — Discharge Summary (Signed)
Karen [name], DOBIS                 ACCOUNT NO.:  [id number]   MEDICAL RECORD NO.:  [id number]          PATIENT TYPE:  INP   LOCATION:  9115                          FACILITY:  [facility]   PHYSICIAN:  Guy Sandifer. Henderson Cloud, M.D. DATE OF BIRTH:  10/24/1970   DATE OF ADMISSION:  01/05/2005  DATE OF DISCHARGE:  01/07/2005                                 DISCHARGE SUMMARY   ADMITTING DIAGNOSIS:  1.  Intrauterine pregnancy at term  2.  Previous cesarean section desires repeat  3.  Thrombocytopenia   DISCHARGE DIAGNOSIS:  1.  Status post low transverse cesarean section  2.  Viable female infant.   PROCEDURE:  Repeat low transverse cesarean section.   REASON FOR ADMISSION:  Please see dictated H&P.   HOSPITAL COURSE:  The patient is a 40 year old gravida 5, para 3 that was  admitted to [facility] for scheduled cesarean section. The  patient had had a previous cesarean section, desired repeat. On the morning  of admission the patient was taken to the operating room where spinal  anesthesia was administered without difficulty. Low transverse incision was  made with delivery a viable female infant weighing 6 pounds 7 ounces with  Apgars of 9 at 1 minute and 9 at 5 minutes. Arterial cord pH of 7.33. The  patient tolerated procedure well and taken to the recovery room in stable  condition. On postoperative day #1 the patient was without complaint. Vital  signs were stable. Fundus was firm and nontender. Abdominal dressing was  noted to be clean, dry and intact. Laboratory findings revealed hemoglobin  of 10.1, WBC count of 7.6 and platelet count was noted be 97,000. On  postoperative day #2 the patient was without complaint. She did desire early  discharge. Vital signs were stable. Fundus was firm and nontender. Incision  was clean, dry and intact. Discharge instructions reviewed. The patient was  later discharged home.   CONDITION ON DISCHARGE:  Stable.   DIET:  Regular as  tolerated.   ACTIVITY:  No heavy lifting, no driving x2 weeks, no vaginal entry.   FOLLOW-UP:  Patient follow up in the office in 1-2 weeks for incision check.  She is to call for temperature greater than 100 degrees, persistent nausea  and vomiting, heavy vaginal bleeding and/or redness or drainage from  incisional site.   DISCHARGE MEDICATIONS:  1.  Motrin 600 milligrams every 6 hours.  2.  Prenatal vitamins one p.o. daily      [name], N.P.      Guy Sandifer. Henderson Cloud, M.D.  Electronically Signed    CC/MEDQ  D:  01/26/2005  T:  01/26/2005  Job:  [id number]

## 2010-11-17 NOTE — Consult Note (Signed)
Karen [name], Karen [name] NO.:  [id number]   MEDICAL RECORD NO.:  [id number]                   PATIENT TYPE:  INP   LOCATION:  2024                                 FACILITY:  [facility]   PHYSICIAN:  Iva Boop, M.D. [facility]           DATE OF BIRTH:  [birth year]   DATE OF CONSULTATION:  10/07/2003  DATE OF DISCHARGE:                                   CONSULTATION   CONSULTING PHYSICIAN:  Iva Boop, M.D. [facility]   REQUESTING PHYSICIAN:  Doylene Canning. [name], M.D.   REASON FOR CONSULTATION:  Abnormalities and hepatomegaly.   HISTORY:  This is a generally healthy 40 year old woman who carries a  diagnosis of idiopathic thrombocytopenic purpura.  This was diagnosed with a  pregnancy in 2001.  Platelet counts have been from the 70,000 to 140,000  range, according to the patient.  She has been followed by Dr. Welton Flakes and  previously by Dr. Catha Gosselin of our hematology group here in [place].  She has  been sick over the past week to ten days, initially started with some nausea  and vomiting type symptoms, similar to what her children had.  They  improved, but she went on to have upper respiratory symptoms with fever and  sore throat, sinus drainage.  This persisted.  She had some chills and a  fever at one  point last week, it sounds like.  She had a dry cough.  She  had seen the doctor and been given Levaquin by Dr. Abigail Miyamoto on Monday of this  week (three days ago), and she had also used an herbal agent called Airborn  which consists of vitamins, and supplements, and some Chinese herbal  extracts on a few times.  She had followed up with Dr. Abigail Miyamoto yesterday,  where she was noted to have sinus bradycardia with a heart rate in the 30s,  and she was admitted because of that.  Originally, on Dr. Norris Cross service,  Dr. [name] saw her for an EP consult and then the patient was transferred to  his service.  She has had persistent cough with some yellow sputum, some  vague chest  pain, and shortness of breath.  She has had upper abdominal  discomfort in the right upper quadrant and in the midline/epigastric area  which she felt was from coughing.  She has been able to eat.  She has an  appetite.  She has had some loose bowel movement today.  She has not felt  nauseous or had any vomiting today however.  There has been no recent fever  noted.  She has never been told she had liver disease.  There is no  significant alcohol consumption.  No history of drug use and no family  history of liver disease.  Last travel was to [place] in the Fall to a resort.  She always has a low heart rate in the 50s.  She exercises a lot but it has  never been this low.  Her blood pressures have been in low normal range.  She denies any history of rash or significant myalgias, as best I can tell.  She has an otherwise negative review of systems.   MEDICATIONS:  None regularly.  In addition to the medications above, i.e.,  the Levaquin and the Airborn supplement, she did take some Advil Cold  Medication a few times in the past week to ten days.   DRUG ALLERGIES:  PENICILLIN.   MEDICATIONS HERE IN THE HOSPITAL:  1. Imdur at 30 mg b.i.d.  2. Ativan 0.5 mg q.6h.  3. She is also on some other p.r.n. medications, antacids, Tylenol.   PAST MEDICAL HISTORY:  1. As above.  2. She has had an emergency cesarean section in 2003.   FAMILY HISTORY:  No gastrointestinal problems, no liver disease as reported  above.   SOCIAL HISTORY:  She is married with three children, here with her husband,  no tobacco, no alcohol, no drug use.   REVIEW OF SYSTEMS:  As mentioned above.  I did not say she had sweats, but  she has had those along with her episodes of chills and fever, though that  does seem to be less at this point.   PHYSICAL EXAMINATION:  GENERAL:  Reveals a well-appearing young white woman  in no acute distress.  VITAL SIGNS:  Temperature 97.1, blood pressure 103/56, pulse 30-33,   respirations non-labored, room air saturation 93%.  EYES:  Anicteric.  MOUTH:  Posterior pharynx free of lesions.  NECK:  Supple without mass or thyromegaly.  CHEST:  Shows some occasional rhonchi but is generally clear.  HEART:  Shows S1 S2 and perhaps a faint 1/6 systolic murmur at the left  sternal border without jugular venous distention or other abnormality.  ABDOMEN:  Thin.  She has some mild tenderness in the epigastric and right  upper quadrant with a liver that is palpable 3-4 finger breadths below the  right costal margin.  It is not firm.  The spleen is not palpable.  EXTREMITIES:  Show no cyanosis, clubbing or edema.  SKIN:  Without rash.  I see no spider angiomata or other stigmata of chronic  liver disease such as prominent veins in the abdomen.   LABORATORY DATA:  Shows a C-MET, from October 06, 2003, at 12:15 with SGOT 42,  SGPT 48, alkaline phosphatase 45, bilirubin 0.4.  The remainder of it is  normal.  Her pro time has an INR of 1.0 with a PT 12.9.  Her PTT is 32.  CBC, on presentation, with differential had a normal differential, the  platelet count was 82,000.  Her white count was 2.6.  Her hemoglobin was  13.9.  Troponin has been negative.  She has had a positive MB fraction in  her CK-MBs but normal total CKs.  TSH is normal.  B-MET is normal except for  chloride of 114.  Today, glucose 102, calcium is slightly low at 8.1.  A D-  dimmer is positive at 1.83.  Room air blood gas:  PH 7.527, pCO2 25, pO2  133, bicarbonate 20.7.   X-rays:  Final report on her CT scan is not back, but I have reviewed it  with the radiologist.  She had a CT of the abdomen and pelvis today which  shows a prominent liver that extends across or to the midline.  There is  fluid around the gallbladder without any  obvious gallbladder wall  thickening.  In my opinion she does have at least one small gallbladder stone.  There is no common bile duct dilation but there is a paucity of fat  which  makes it difficult to evaluate that.  There is no intrahepatic  dilation.  The spleen looks normal to slightly prominent.  There is also  fluid in the pelvis.   Chest x-ray has shown on infiltrates.   ASSESSMENT:  1. Hepatomegaly.  2. Mildly abnormal transaminases in a woman with a worsened bradycardia and     recent presumed viral syndrome.  The etiology of this is not entirely     clear at this point.   She is certainly not acutely ill or toxic though the bradycardia is what has  brought her into the hospital.  She could have ascites from intrinsic liver  disease, and the history of ITP raises the question of a missed diagnosis  and perhaps intrinsic liver process such as a chronic autoimmune hepatitis  or an inherited liver disease.  Certainly an acute viral syndrome can cause  problems like this, and she may have an acute hepatitis problem, though I  would expect a higher elevation in her transaminases.  She had an  echocardiogram that showed a normal ejection fraction that was low normal.  Perhaps she has some diastolic dysfunction or some other process.  She could  have some hepatic congestion, though I did not see any obvious hepatojugular  reflux.  The possibility of cholecystitis exists, though I think that is  unlikely.   RECOMMENDATIONS/PLAN:  We have sent a battery of studies that include AMA,  ANA, alpha-1 antitrypsin level, ceruloplasmin level, and acute hepatitis A,  and chronic hepatitis B, and hepatitis C serologies, acute CMV, and Epstein-  Barr virus serologies as well.  We will also add a fasting ferritin and iron  saturations in the morning.  We will go ahead a scheduled a HIDA scan.  I  broached the possibility of a paracentesis to try to analyze her ascites,  but the husband was opposed to that because of the invasive nature and it is  not unreasonable to pursue a HIDA scan first in this setting, though she may  come to need a paracentesis.  The patient's  husband had many questions which  I answered all of and at the end of the consultation he and his wife  indicated they did not have any further questions.  I did indicate the  uncertainty of the diagnosis at this time, though we thought that she was  having some sort of viral episode that was causing the leukopenia, and  perhaps exacerbating her thrombocytopenia.  The possibility of underlying of  chronic liver disease still exists as well as cholecystitis, though I did  not favor that.                                               Iva Boop, M.D. [facility]    CEG/MEDQ  D:  10/07/2003  T:  10/08/2003  Job:  [id number]   cc:   Doylene Canning. [name], M.D.   [facility]ge Gainer. Thacker, Dr.

## 2011-11-05 ENCOUNTER — Other Ambulatory Visit: Payer: Self-pay

## 2011-11-05 DIAGNOSIS — I83893 Varicose veins of bilateral lower extremities with other complications: Secondary | ICD-10-CM

## 2011-11-28 ENCOUNTER — Encounter: Payer: Self-pay | Admitting: Vascular Surgery

## 2011-12-03 ENCOUNTER — Encounter: Payer: Self-pay | Admitting: Vascular Surgery

## 2011-12-04 ENCOUNTER — Encounter (INDEPENDENT_AMBULATORY_CARE_PROVIDER_SITE_OTHER): Payer: BC Managed Care – PPO | Admitting: *Deleted

## 2011-12-04 ENCOUNTER — Ambulatory Visit (INDEPENDENT_AMBULATORY_CARE_PROVIDER_SITE_OTHER): Payer: BC Managed Care – PPO | Admitting: Vascular Surgery

## 2011-12-04 ENCOUNTER — Encounter: Payer: Self-pay | Admitting: Vascular Surgery

## 2011-12-04 VITALS — BP 116/71 | HR 42 | Resp 16 | Ht 68.0 in | Wt 127.7 lb

## 2011-12-04 DIAGNOSIS — R609 Edema, unspecified: Secondary | ICD-10-CM

## 2011-12-04 DIAGNOSIS — M79609 Pain in unspecified limb: Secondary | ICD-10-CM | POA: Insufficient documentation

## 2011-12-04 DIAGNOSIS — I83893 Varicose veins of bilateral lower extremities with other complications: Secondary | ICD-10-CM | POA: Insufficient documentation

## 2011-12-04 NOTE — Progress Notes (Signed)
Patient presents today for evaluation of left leg venous hypertension. I had seen her in 2004 and 2008 for similar symptoms. 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 She has no history of DVT and has no history of bleeding or superficial thrombophlebitis. He reports discomfort in his greater with prolonged standing and activity.  Past Medical History  Diagnosis Date  . Varicose veins   . Urinary, incontinence, stress female   . Lipoma     4-5 cm lipoma right upper quadrant (abdomen)  . Osteopenia   . Anemia     History  Substance Use Topics  . Smoking status: Never Smoker   . Smokeless tobacco: Not on file  . Alcohol Use: Yes     once weekly    History reviewed. No pertinent family history.  Allergies  Allergen Reactions  . Penicillins     Current outpatient prescriptions:cholecalciferol (VITAMIN D) 1000 UNITS tablet, Take 1,000 Units by mouth daily., Disp: , Rfl: ;  Coconut Oil 1000 MG CAPS, Take by mouth daily., Disp: , Rfl: ;  Cyanocobalamin (VITAMIN B-12 CR PO), Take by mouth 2 (two) times daily., Disp: , Rfl: ;  fish oil-omega-3 fatty acids 1000 MG capsule, Take 3 g by mouth daily., Disp: , Rfl:  glucosamine-chondroitin 500-400 MG tablet, Take 1 tablet by mouth daily., Disp: , Rfl: ;  Nutritional Supplements (JUICE PLUS FIBRE PO), Take by mouth daily., Disp: , Rfl:   BP 116/71  Pulse 42  Resp 16  Ht 5\' 8"  (1.727 m)  Wt 127 lb 11.2 oz (57.924 kg)  BMI 19.42 kg/m2  Body mass index is 19.42 kg/(m^2).       Review of systems: Pulmonary is positive for wheezing neurologic as noted for some numbness in her legs review of systems otherwise negative  Physical exam well-developed well-nourished white female in no acute distress. 2+ dorsalis pedis pulses bilaterally Neurologically grossly intact Skin without ulcers rashes or  changes of venous hypertension Respirations are nonlabored She does have no evidence of varicosities in her right leg. She does have marked varicosities throughout her medial left calf extending onto the dorsum of her foot. She does have dilated saphenous vein to her thigh which is palpable due to her being thin.  Noninvasive venous duplex in our office reveals gross reflux in her left great saphenous vein with the with the dilatation. No significant reflux in her right great saphenous vein. She does mild reflux in her left deep system  Impression and plan: Progressive changes of venous hypertension with worsening varicosities and worsening pain. The patient is fitted today with thigh high 20-30 mm mercury compression garments. She is instructed on the use of these. We will see her again in 3 months for continued discussion. She would be an excellent candidate for laser ablation of her great saphenous vein and stab phlebectomy of tributary varicosities should she fail conservative treatment

## 2011-12-05 ENCOUNTER — Telehealth: Payer: Self-pay | Admitting: *Deleted

## 2011-12-05 NOTE — Telephone Encounter (Signed)
Called [employer] [place] [employer] [place] and spoke with Abeytas on 12-04-2011 regarding benefits and eligibility for office surgery.  Mrs. Safran has a HSA policy that has a $10,000 deductible of which only $465.93 has been met.  For office surgery, the $10,000 must be met in full before [employer] [place] will pay for office surgery. Reference # [id number].  Informed Mrs. Lamoreaux on 12-05-2011 by phone of the [facility] requirements for office surgery.  Mrs. Seres said she will discuss the insurance specifics with her husband and let me know how she wants to proceed.  Pauline Pegues, Neena Rhymes

## 2011-12-12 NOTE — Procedures (Unsigned)
LOWER EXTREMITY VENOUS REFLUX EXAM  INDICATION:  Varicose veins.  EXAM:  Using color-flow imaging and pulse Doppler spectral analysis, the bilateral common femoral, femoral, popliteal, posterior tibial, great and small saphenous veins were evaluated.  There is evidence suggesting mild deep insufficiency in the bilateral lower extremity.  The bilateral saphenofemoral junction is not competent, left greater than right, with Reflux of >568milliseconds. The left greater saphenous vein is not competent from the proximal thigh to medial knee/upper calf.  The bilateral proximal small saphenous vein demonstrates competency.  GSV Diameter (used if found to be incompetent only)                                           Right    Left Proximal Greater Saphenous Vein           cm       0.74 cm Proximal-to-mid-thigh                     cm       0.47 cm Mid thigh                                 cm       1.09 cm Mid-distal thigh                          cm       0.60 cm Distal thigh                              cm       1.03 cm Knee                                      cm       0.88 cm   IMPRESSION: 1. The left greater saphenous vein is not competent with Reflux of     >589milliseconds.  The right greater saphenous vein is competent. 2. The bilateral greater saphenous vein is not tortuous. 3. The deep venous system bilaterally is mildly incompetent with     Reflux of >566milliseconds. 4. The bilateral small saphenous vein is competent. 5. No evidence of acute deep vein thrombosis bilaterally.       ___________________________________________ Larina Earthly, M.D.  SS/MEDQ  D:  12/04/2011  T:  12/04/2011  Job:  [id number]

## 2011-12-13 ENCOUNTER — Encounter: Payer: Self-pay | Admitting: Vascular Surgery

## 2012-03-11 ENCOUNTER — Ambulatory Visit: Payer: BC Managed Care – PPO | Admitting: Vascular Surgery

## 2012-03-17 ENCOUNTER — Encounter: Payer: Self-pay | Admitting: Vascular Surgery

## 2012-03-18 ENCOUNTER — Ambulatory Visit: Payer: BC Managed Care – PPO | Admitting: Vascular Surgery

## 2012-03-24 ENCOUNTER — Encounter: Payer: Self-pay | Admitting: Vascular Surgery

## 2012-03-25 ENCOUNTER — Encounter: Payer: Self-pay | Admitting: Vascular Surgery

## 2012-03-25 ENCOUNTER — Ambulatory Visit (INDEPENDENT_AMBULATORY_CARE_PROVIDER_SITE_OTHER): Payer: BC Managed Care – PPO | Admitting: Vascular Surgery

## 2012-03-25 VITALS — BP 115/73 | HR 50 | Resp 16 | Ht 68.0 in | Wt 129.8 lb

## 2012-03-25 DIAGNOSIS — I83893 Varicose veins of bilateral lower extremities with other complications: Secondary | ICD-10-CM

## 2012-03-25 NOTE — Progress Notes (Signed)
Problems with Activities of Daily Living Secondary to Leg Pain  1. Mrs. Karen [name] states exercise is painful and difficult due to leg pain.  2. Mrs. Karen [name] states she has difficulty with cooking, cleaning,and shopping, any activities that require prolonged standing due to leg pain.     Failure of  Conservative Therapy:  1. Worn 20-30 mm Hg thigh high compression hose >3 months with no relief of symptoms.  2. Frequently elevates legs-no relief of symptoms  3. Taken Ibuprofen 600 Mg TID with no relief of symptoms.  Patient has today for any discussion of her left leg venous pathology. She has been compliant wearing her compression garments but continues to have discomfort today for prolonged standing. She is quite active and exercises and had to curtail is due to pain as well. Her physical exam remains unchanged with large varix containing throughout her thigh and large plexus of tributary varicosities in her medial calf extending onto the dorsum of her foot. I reimaged her vein with SonoSite and this does show reflux in the saphenous vein throughout the thigh and the large tributary branches arising from this. I feel that she has failed conservative treatment and would recommend laser ablation and stab phlebectomy of her multiple tributary varicosities. She will blindness can work around her daily activities. She understands this is an outpatient under local anesthesia with a slight risk of injury to the deep system with the procedure

## 2012-07-22 ENCOUNTER — Other Ambulatory Visit: Payer: Self-pay | Admitting: *Deleted

## 2012-07-22 DIAGNOSIS — I83893 Varicose veins of bilateral lower extremities with other complications: Secondary | ICD-10-CM

## 2012-07-31 ENCOUNTER — Telehealth: Payer: Self-pay | Admitting: *Deleted

## 2012-07-31 ENCOUNTER — Other Ambulatory Visit: Payer: Self-pay | Admitting: *Deleted

## 2012-07-31 MED ORDER — LORAZEPAM 1 MG PO TABS: ORAL_TABLET | ORAL | Status: DC

## 2012-07-31 NOTE — Telephone Encounter (Signed)
Notified Mr. Karen [name] that I had phoned in prescription to [facility] on [address] In [place], [place] for Ativan to help manage anxiety during office surgery on 08-07-2012.  Discussed with him that Karen [name] should take Ativan 1 mg. Tablet 30-60 minutes prior to procedure and bring the remaining tablet to [facility] with her in case it is needed.  Discussed with him that he should drive Karen [name] to appointment on 08-07-2012 for office surgery as she should not drive while taking Ativan.  Karen [name] verbalized understanding and will share this information with his wife.

## 2012-08-06 ENCOUNTER — Encounter: Payer: Self-pay | Admitting: Vascular Surgery

## 2012-08-07 ENCOUNTER — Ambulatory Visit (INDEPENDENT_AMBULATORY_CARE_PROVIDER_SITE_OTHER): Payer: BC Managed Care – PPO | Admitting: Vascular Surgery

## 2012-08-07 ENCOUNTER — Encounter: Payer: Self-pay | Admitting: Vascular Surgery

## 2012-08-07 VITALS — BP 126/80 | HR 61 | Resp 18 | Ht 68.0 in | Wt 129.0 lb

## 2012-08-07 DIAGNOSIS — I83893 Varicose veins of bilateral lower extremities with other complications: Secondary | ICD-10-CM

## 2012-08-07 HISTORY — PX: ENDOVENOUS ABLATION SAPHENOUS VEIN W/ LASER: SUR449

## 2012-08-07 NOTE — Progress Notes (Signed)
Laser Ablation Procedure      Date: 08/07/2012    Karen [name] DOB:[birth year]  Consent signed: Yes  Surgeon:T.F. Berlynn Warsame  Procedure: Laser Ablation: left Greater Saphenous Vein  BP 126/80  Pulse 61  Resp 18  Ht 5\' 8"  (1.727 m)  Wt 129 lb (58.514 kg)  BMI 19.61 kg/m2  Start time: 8:45am   End time: 10:35am  Tumescent Anesthesia: 475 cc 0.9% NaCl with 50 cc Lidocaine HCL with 1% Epi and 15 cc 8.4% NaHCO3  Local Anesthesia: 4 cc Lidocaine HCL and NaHCO3 (ratio 2:1)  Continuous Mode: 15 Watts Total [employer] 2404 Joules Total Time2:40    Stab Phlebectomy: 10-20 Sites: Calf and Ankle and top of foot Left leg  Patient tolerated procedure well: Yes  Notes: Ativan 1 mg. 2 tablets taken by [name] prior to procedure.  Description of Procedure:  After marking the course of the saphenous vein and the secondary varicosities in the standing position, the patient was placed on the operating table in the supine position, and the left leg was prepped and draped in sterile fashion. Local anesthetic was administered, and under ultrasound guidance the saphenous vein was accessed with a micro needle and guide wire; then the micro puncture sheath was placed. A guide wire was inserted to the saphenofemoral junction, followed by a 5 french sheath.  The position of the sheath and then the laser fiber below the junction was confirmed using the ultrasound and visualization of the aiming beam.  Tumescent anesthesia was administered along the course of the saphenous vein using ultrasound guidance. Protective laser glasses were placed on the patient, and the laser was fired at at 15 watt continuous mode.  For a total of 2404 joules.  A steri strip was applied to the puncture site.  The patient was then put into Trendelenburg position.  Local anesthetic was utilized overlying the marked varicosities.  Greater than 10-20 stab wounds were made using the tip of an 11 blade; and using the vein hook,  The  phlebectomies were performed using a hemostat to avulse these varicosities.  Adequate hemostasis was achieved, and steri strips were applied to the stab wound.      ABD pads and thigh high compression stockings were applied.  Ace wrap bandages were applied over the phlebectomy sites and at the top of the saphenofemoral junction.  Blood loss was less than 15 cc.  The patient ambulated out of the operating room having tolerated the procedure well.

## 2012-08-08 ENCOUNTER — Encounter: Payer: Self-pay | Admitting: Vascular Surgery

## 2012-08-12 ENCOUNTER — Encounter: Payer: Self-pay | Admitting: Vascular Surgery

## 2012-08-12 ENCOUNTER — Telehealth: Payer: Self-pay | Admitting: *Deleted

## 2012-08-12 NOTE — Telephone Encounter (Signed)
08/12/2012  Time: 8:59 AM   Patient Name: Karen [name]  Patient of: T.F. Early  Procedure:Laser Ablation left  Great saphenous vein and stab phlebectomy 10-20 incisions left leg 08-07-2012   Reached patient at home and checked  Her status  Yes    Comments/Actions Taken: Spoke with [name] who states no problems with bleeding/oozing or swelling.  [name] states this morning she experienced sharp pain in her left ankle that was relieved by walking. Encouraged her to wear compression dressing as directed, to elevate left leg when sitting, use ice pack as needed for pain, Ibuprofen as directed, and Arnicare as needed for left leg pain.  Reminded her of follow up duplex and follow up appointment with Dr. Arbie Cookey on 08-14-2012.  [name] to call [facility] if she has questions or concerns.    @SIGNATURE @

## 2012-08-14 ENCOUNTER — Ambulatory Visit: Payer: BC Managed Care – PPO | Admitting: Vascular Surgery

## 2012-08-18 ENCOUNTER — Encounter: Payer: Self-pay | Admitting: Vascular Surgery

## 2012-08-19 ENCOUNTER — Ambulatory Visit (INDEPENDENT_AMBULATORY_CARE_PROVIDER_SITE_OTHER): Payer: BC Managed Care – PPO | Admitting: Vascular Surgery

## 2012-08-19 ENCOUNTER — Encounter: Payer: Self-pay | Admitting: Vascular Surgery

## 2012-08-19 ENCOUNTER — Encounter (INDEPENDENT_AMBULATORY_CARE_PROVIDER_SITE_OTHER): Payer: BC Managed Care – PPO | Admitting: *Deleted

## 2012-08-19 VITALS — BP 110/70 | HR 72 | Resp 18 | Ht 68.0 in | Wt 129.0 lb

## 2012-08-19 DIAGNOSIS — I83893 Varicose veins of bilateral lower extremities with other complications: Secondary | ICD-10-CM

## 2012-08-19 DIAGNOSIS — Z48811 Encounter for surgical aftercare following surgery on the nervous system: Secondary | ICD-10-CM

## 2012-08-19 NOTE — Progress Notes (Signed)
The patient presents today for followup of her knees her ablation of her left great saphenous vein from her knee to her saphenofemoral junction and stab phlebectomy of multiple tributary varicosities extending throughout her calf and onto the dorsum of her foot. She has been compliant with her compression garments.  She did have more than the typical amount of discomfort at the time of her surgery despite oral pretreatment with Ativan. She has certainly had more discomfort following the procedure than is typical as well. This is mostly in the location of the ablation from her knee to her groin. She has mild discomfort and multiple phlebectomy sites in her calf of the dorsum of her foot.  On physical exam she does have the expected thickening in the thrombosed great saphenous vein to her thigh. Her main does run relatively close to the surface. She has excellent result from her stab phlebectomy of her multiple large varicosities in her calf and dorsum of her foot. There is typical amount of bruising associated with this.  Venous duplex today reveals closure of her great saphenous vein from the distal insertion site the level of her knee up to the saphenofemoral junction with no evidence of DVT  I discussed this at length with the patient and her husband present. I explained that she is the expected result from a standpoint of physical exam and ultrasound. I think she certainly has had more than the usual expected amount of discomfort associated with this. I explained this is been variable and that it will completely resolve with time. She will begin to increase her activity and will wear compression on a when necessary basis she will continue to slowly increase her physical activity as well  We will see her again in one month for continued followup

## 2012-09-15 ENCOUNTER — Encounter: Payer: Self-pay | Admitting: Vascular Surgery

## 2012-09-16 ENCOUNTER — Encounter: Payer: Self-pay | Admitting: Vascular Surgery

## 2012-09-16 ENCOUNTER — Ambulatory Visit (INDEPENDENT_AMBULATORY_CARE_PROVIDER_SITE_OTHER): Payer: BC Managed Care – PPO | Admitting: Vascular Surgery

## 2012-09-16 VITALS — BP 119/74 | HR 50 | Resp 18 | Ht 68.0 in | Wt 130.0 lb

## 2012-09-16 DIAGNOSIS — I83893 Varicose veins of bilateral lower extremities with other complications: Secondary | ICD-10-CM

## 2012-09-16 NOTE — Progress Notes (Signed)
Here today for a followup after ablation of her great saphenous vein from below the knee to the saphenofemoral junction. Said that multiple tributary varicosities in her medial calf extending onto the lateral dorsum of her foot. He had more than the usual amount of discomfort during the procedure and also at her one week followup. She is seen today for further followup and discussion.  He has had was resolution of all of her bruising. Her incisions all looked quite good. She is thin and her saphenous vein does run a superficially. She has a palpable thrombosed saphenous vein. She does have some tingling at the distribution of the saphenous nerve over her ankle and onto her foot. Ex-preemie at this should continue to resolve with time. Her stab incisions looked quite good. Pleased with the results as is the patient. She will see [place] again on an as-needed basis.

## 2013-08-18 ENCOUNTER — Ambulatory Visit (INDEPENDENT_AMBULATORY_CARE_PROVIDER_SITE_OTHER): Payer: 59 | Admitting: Surgery

## 2013-09-08 ENCOUNTER — Ambulatory Visit (INDEPENDENT_AMBULATORY_CARE_PROVIDER_SITE_OTHER): Payer: 59 | Admitting: Surgery

## 2013-10-19 ENCOUNTER — Ambulatory Visit (INDEPENDENT_AMBULATORY_CARE_PROVIDER_SITE_OTHER): Payer: 59 | Admitting: Surgery

## 2014-01-06 ENCOUNTER — Ambulatory Visit (INDEPENDENT_AMBULATORY_CARE_PROVIDER_SITE_OTHER): Payer: 59 | Admitting: Surgery

## 2014-01-06 ENCOUNTER — Encounter (INDEPENDENT_AMBULATORY_CARE_PROVIDER_SITE_OTHER): Payer: Self-pay | Admitting: Surgery

## 2014-01-06 VITALS — BP 126/78 | HR 57 | Temp 98.0°F | Ht 68.0 in | Wt 129.0 lb

## 2014-01-06 DIAGNOSIS — D1730 Benign lipomatous neoplasm of skin and subcutaneous tissue of unspecified sites: Secondary | ICD-10-CM | POA: Insufficient documentation

## 2014-01-06 DIAGNOSIS — M6208 Separation of muscle (nontraumatic), other site: Secondary | ICD-10-CM

## 2014-01-06 DIAGNOSIS — D1739 Benign lipomatous neoplasm of skin and subcutaneous tissue of other sites: Secondary | ICD-10-CM

## 2014-01-06 DIAGNOSIS — M6200 Separation of muscle (nontraumatic), unspecified site: Secondary | ICD-10-CM

## 2014-01-06 DIAGNOSIS — D171 Benign lipomatous neoplasm of skin and subcutaneous tissue of trunk: Secondary | ICD-10-CM | POA: Insufficient documentation

## 2014-01-06 NOTE — Patient Instructions (Signed)
Lipoma  A lipoma is a noncancerous (benign) tumor composed of fat cells. They are usually found under the skin (subcutaneous). A lipoma may occur in any tissue of the body that contains fat. Common areas for lipomas to appear include the back, shoulders, buttocks, and thighs. Lipomas are a very common soft tissue growth. They are soft and grow slowly. Most problems caused by a lipoma depend on where it is growing.  DIAGNOSIS   A lipoma can be diagnosed with a physical exam. These tumors rarely become cancerous, but radiographic studies can help determine this for certain. Studies used may include:  · Computerized X-ray scans (CT or CAT scan).  · Computerized magnetic scans (MRI).  TREATMENT   Small lipomas that are not causing problems may be watched. If a lipoma continues to enlarge or causes problems, removal is often the best treatment. Lipomas can also be removed to improve appearance. Surgery is done to remove the fatty cells and the surrounding capsule. Most often, this is done with medicine that numbs the area (local anesthetic). The removed tissue is examined under a microscope to make sure it is not cancerous. Keep all follow-up appointments with your caregiver.  SEEK MEDICAL CARE IF:   · The lipoma becomes larger or hard.  · The lipoma becomes painful, red, or increasingly swollen. These could be signs of infection or a more serious condition.  Document Released: 06/08/2002 Document Revised: 09/10/2011 Document Reviewed: 11/18/2009  ExitCare® Patient Information ©2015 [employer]. This information is not intended to replace advice given to you by your health care provider. Make sure you discuss any questions you have with your health care provider.

## 2014-01-06 NOTE — Progress Notes (Signed)
General Surgery [facility]  Chief Complaint  Patient presents with  . New Evaluation    lipoma on abdominal wall - referral from Dr. Arvella Nigh and Dr. Billey Chang    HISTORY: Patient is a 43 year old female referred by her primary care physician and her gynecologist for evaluation of a soft tissue mass in the right upper quadrant of the abdominal wall and possible umbilical hernia. Patient states that both of these been present for many years. The soft tissue mass may have increased slightly in size. Patient denies any abdominal pain except with episodes of constipation or bloating. Previous abdominal surgery includes laparoscopic cholecystectomy by Dr. Annabell Sabal.  No diagnostic studies have been performed.  Past Medical History  Diagnosis Date  . Varicose veins   . Urinary, incontinence, stress female   . Lipoma     4-5 cm lipoma right upper quadrant (abdomen)  . Osteopenia   . Anemia     Current Outpatient Prescriptions  Medication Sig Dispense Refill  . cholecalciferol (VITAMIN D) 1000 UNITS tablet Take 1,000 Units by mouth daily.      . Coconut Oil 1000 MG CAPS Take by mouth daily.      . Cyanocobalamin (VITAMIN B-12 CR PO) Take by mouth 2 (two) times daily.      . fish oil-omega-3 fatty acids 1000 MG capsule Take 3 g by mouth daily.      Marland Kitchen glucosamine-chondroitin 500-400 MG tablet Take 1 tablet by mouth daily.      Marland Kitchen ibuprofen (ADVIL,MOTRIN) 600 MG tablet Take 600 mg by mouth every 6 (six) hours as needed for pain.      . Nutritional Supplements (JUICE PLUS FIBRE PO) Take by mouth daily.       No current facility-administered medications for this visit.    Allergies  Allergen Reactions  . Penicillins     History reviewed. No pertinent family history.  History   Social History  . Marital Status: Married    Spouse Name: N/A    Number of Children: N/A  . Years of Education: N/A   Social History Main Topics  . Smoking status: Never  Smoker   . Smokeless tobacco: None  . Alcohol Use: Yes     Comment: once weekly  . Drug Use: No  . Sexual Activity: None   Other Topics Concern  . None   Social History Narrative  . None    REVIEW OF SYSTEMS - PERTINENT POSITIVES ONLY: Slight increase in size soft tissue mass right upper quadrant abdominal wall, mild discomfort with constipation  EXAM: Filed Vitals:   01/06/14 0920  BP: 126/78  Pulse: 57  Temp: 98 F (36.7 C)    GENERAL: well-developed, well-nourished, no acute distress HEENT: normocephalic; pupils equal and reactive; sclerae clear; dentition good; mucous membranes moist NECK:  No palpable masses in the thyroid bed; symmetric on extension; no palpable anterior or posterior cervical lymphadenopathy; no supraclavicular masses; no tenderness CHEST: clear to auscultation bilaterally without rales, rhonchi, or wheezes CARDIAC: regular rate and rhythm without significant murmur; peripheral pulses are full ABDOMEN: soft without distension; bowel sounds present; no mass; no hepatosplenomegaly; no hernia; soft tissue mass right upper quadrant abdominal wall just below the costal margin, 4 x 8 x 2 cm, soft, mobile, consistent with lipoma; mild rectus diastasis the level of umbilicus with approximately 3 cm separation of the rectus musculature, no palpable fascial defect EXT:  non-tender without edema; no deformity NEURO: no gross focal deficits;  no sign of tremor   LABORATORY RESULTS: See [facility] (CHL-Epic) for most recent results  RADIOLOGY RESULTS: See [facility] (CHL-Epic) for most recent results  IMPRESSION: #1 probable lipoma, right upper quadrant abdominal wall, 4 x 8 x 2 cm, clinically benign #2 mild rectus diastasis  PLAN: I discussed both of these findings with the patient. I explained what type of surgical procedure would be involved with treatment. I explained to both of these were benign findings. As she has limited symptoms, the patient  is not interested in proceeding with any surgery at this time. We will continue to monitor as needed. She will return for surgical care as needed.  Earnstine Regal, MD, [facility]  Primary Care Physician: Leamon Arnt, MD

## 2014-03-17 ENCOUNTER — Other Ambulatory Visit: Payer: Self-pay | Admitting: Plastic Surgery

## 2014-06-18 ENCOUNTER — Other Ambulatory Visit (HOSPITAL_COMMUNITY): Payer: Self-pay | Admitting: Chiropractic Medicine

## 2014-06-18 ENCOUNTER — Ambulatory Visit (HOSPITAL_COMMUNITY)
Admission: RE | Admit: 2014-06-18 | Discharge: 2014-06-18 | Disposition: A | Discharge status: Home / Self Care | Payer: No Typology Code available for payment source | Source: Ambulatory Visit | Attending: Chiropractic Medicine | Admitting: Chiropractic Medicine

## 2014-06-18 DIAGNOSIS — M419 Scoliosis, unspecified: Secondary | ICD-10-CM | POA: Insufficient documentation

## 2014-06-18 DIAGNOSIS — M25551 Pain in right hip: Secondary | ICD-10-CM | POA: Insufficient documentation

## 2014-10-14 ENCOUNTER — Other Ambulatory Visit: Payer: Self-pay | Admitting: Obstetrics and Gynecology

## 2014-10-15 LAB — CYTOLOGY - PAP

## 2014-11-08 ENCOUNTER — Ambulatory Visit (INDEPENDENT_AMBULATORY_CARE_PROVIDER_SITE_OTHER): Payer: BLUE CROSS/BLUE SHIELD | Admitting: Neurology

## 2014-11-08 ENCOUNTER — Encounter: Payer: Self-pay | Admitting: Neurology

## 2014-11-08 VITALS — BP 106/72 | HR 45 | Temp 98.3°F | Ht 68.0 in | Wt 129.0 lb

## 2014-11-08 DIAGNOSIS — R4781 Slurred speech: Secondary | ICD-10-CM | POA: Insufficient documentation

## 2014-11-08 DIAGNOSIS — G43109 Migraine with aura, not intractable, without status migrainosus: Secondary | ICD-10-CM

## 2014-11-08 DIAGNOSIS — R001 Bradycardia, unspecified: Secondary | ICD-10-CM

## 2014-11-08 DIAGNOSIS — G43809 Other migraine, not intractable, without status migrainosus: Secondary | ICD-10-CM

## 2014-11-08 DIAGNOSIS — F05 Delirium due to known physiological condition: Secondary | ICD-10-CM | POA: Diagnosis not present

## 2014-11-08 DIAGNOSIS — H9193 Unspecified hearing loss, bilateral: Secondary | ICD-10-CM | POA: Diagnosis not present

## 2014-11-08 DIAGNOSIS — R42 Dizziness and giddiness: Secondary | ICD-10-CM | POA: Diagnosis not present

## 2014-11-08 DIAGNOSIS — R410 Disorientation, unspecified: Secondary | ICD-10-CM | POA: Insufficient documentation

## 2014-11-08 MED ORDER — NORTRIPTYLINE HCL 10 MG PO CAPS: 10.0000 mg | ORAL_CAPSULE | Freq: Every day | ORAL | Status: DC

## 2014-11-08 NOTE — Patient Instructions (Signed)
Overall you are doing fairly well but I do want to suggest a few things today:   Remember to drink plenty of fluid, eat healthy meals and do not skip any meals. Try to eat protein with a every meal and eat a healthy snack such as fruit or nuts in between meals. Try to keep a regular sleep-wake schedule and try to exercise daily, particularly in the form of walking, 20-30 minutes a day, if you can.   As far as your medications are concerned, I would like to suggest: Nortriptyline 10mg  before bed  As far as diagnostic testing: MRI of the brain, MRA of the head ENT evaluation Vestibular Therapy EKG  I would like to see you back in 3 months, sooner if we need to. Please call us with any interim questions, concerns, problems, updates or refill requests.   Please also call us for any test results so we can go over those with you on the phone.  My clinical assistant and will answer any of your questions and relay your messages to me and also relay most of my messages to you.   Our phone number is [phone]. We also have an after hours call service for urgent matters and there is a physician on-call for urgent questions. For any emergencies you know to call 911 or go to the nearest emergency room

## 2014-11-08 NOTE — Progress Notes (Signed)
[facility]    Provider:  Dr Jaynee Eagles Referring Provider: Arvella Nigh, MD Primary Care Physician:  Leamon Arnt, MD  CC:  Vertigo and headache  HPI:  Karen [name] is a 44 y.o. female here as a referral from Dr. Radene Knee for vertigo and headache   They came home from [place] on the 4th of April from vacation. Vertigo and Dizziness started on the 5th.   Vertigo with room spinning and nausea, brief better when stay still . Hearing problems, hearing loss with feeling of clogged ears.  She had the Dix-Hallpike maneuver completed in the PCP office with significant nausea, vertigo with head movement to the right. When she looked to the right. Got better with exercises she was given for BPPV. For a week and a half it improved after doing exercises. Had side effects from Meclizine and stopped taking it. Symptoms are worsening. She was doing well until when she went to the office 2 weeks later and had to file then had the symptoms again. She was at a sports game and she kept looking up and watching the game back and forth, made her feel very bad. She is exhausted afterwards. Now she has new onset headaches, she is getting headaches, sound and light makes headache worse. Pressure in the whole head. Some light sensitivity. No history of migraines or headaches in the past, no FHx of headaches either. She has slurred her words with the vertigo and is having episodes of confusion with slurring . She doesn't have to make movements to precipitate the vertigo. She is having more confusion and memory problems.  No sinus problems. No previous headaches. No family history of migraines. She has a lot of stress in her life. No focal weakness or vision changes. No stiff neck or systemic signs.  Review of Systems: Patient complains of symptoms per HPI as well as the following symptoms: NO CP, no SOB. Pertinent negatives per HPI. All others negative.   History   Social History  . Marital Status: Married     Spouse Name: Glendell Docker  . Number of Children: 4  . Years of Education: Ba   Occupational History  . Administration     Social History Main Topics  . Smoking status: Never Smoker   . Smokeless tobacco: Not on file  . Alcohol Use: Yes     Comment: once weekly  . Drug Use: No  . Sexual Activity: Not on file   Other Topics Concern  . Not on file   Social History Narrative   Lives at home with husband and 4 kids   Caffeine use: tea occas    No family history on file.  Past Medical History  Diagnosis Date  . Varicose veins   . Urinary, incontinence, stress female   . Lipoma     4-5 cm lipoma right upper quadrant (abdomen)  . Osteopenia   . Anemia     Past Surgical History  Procedure Laterality Date  . Cesarean section  2003, 2006  . Cholecystectomy  2005  . Endovenous ablation saphenous vein w/ laser  08-07-2012    left greater saphenous vein and stab phlebectomy 10-20 incisions left leg  by Curt Jews MD    Current Outpatient Prescriptions  Medication Sig Dispense Refill  . cholecalciferol (VITAMIN D) 1000 UNITS tablet Take 1,000 Units by mouth daily.    . Cyanocobalamin (VITAMIN B-12 CR PO) Take by mouth 2 (two) times daily.    . fish oil-omega-3 fatty acids  1000 MG capsule Take 3 g by mouth daily.    Marland Kitchen glucosamine-chondroitin 500-400 MG tablet Take 1 tablet by mouth daily.    Marland Kitchen ibuprofen (ADVIL,MOTRIN) 600 MG tablet Take 600 mg by mouth every 6 (six) hours as needed for pain.     No current facility-administered medications for this visit.    Allergies as of 11/08/2014 - Review Complete 11/08/2014  Allergen Reaction Noted  . Penicillins  11/28/2011    Vitals: BP 106/72 mmHg  Pulse 45  Temp(Src) 98.3 F (36.8 C)  Ht 5\' 8"  (1.727 m)  Wt 129 lb (58.514 kg)  BMI 19.62 kg/m2 Last Weight:  Wt Readings from Last 1 Encounters:  11/08/14 129 lb (58.514 kg)   Last Height:   Ht Readings from Last 1 Encounters:  11/08/14 5\' 8"  (1.727 m)    Physical  exam: Exam: Gen: NAD, conversant, well nourised, well groomed                     CV: RRR, no MRG. No Carotid Bruits. No peripheral edema, warm, nontender Eyes: Conjunctivae clear without exudates or hemorrhage  Neuro: Detailed Neurologic Exam  Speech:    Speech is normal; fluent and spontaneous with normal comprehension.  Cognition:    The patient is oriented to person, place, and time;     recent and remote memory intact;     language fluent;     normal attention, concentration,     fund of knowledge Cranial Nerves:    The pupils are equal, round, and reactive to light. The fundi are normal and spontaneous venous pulsations are present. Visual fields are full to finger confrontation. Extraocular movements are intact. Trigeminal sensation is intact and the muscles of mastication are normal. The face is symmetric. The palate elevates in the midline. Hearing intact. Voice is normal. Shoulder shrug is normal. The tongue has normal motion without fasciculations.   Coordination:    Normal finger to nose and heel to shin. Normal rapid alternating movements.   Gait:    Heel-toe and tandem gait are normal.   Motor Observation:    No asymmetry, no atrophy, and no involuntary movements noted. Tone:    Normal muscle tone.    Posture:    Posture is normal. normal erect    Strength:    Strength is V/V in the upper and lower limbs.      Sensation: intact to LT     Reflex Exam:  DTR's:    Deep tendon reflexes in the upper and lower extremities are normal bilaterally.   Toes:    The toes are downgoing bilaterally.   Clonus:    Clonus is absent.   Assessment/Plan:   44 year old female who is here for vertigo associated with headache and hearing loss. Marye Round was positive in PCP office, she does not want to repeat today. Vertigo happens with head movement and sudden movements but also having confusion, speech slurring, hearing loss.  Appears peripheral but given associated  symptoms need to rule out central cause of vertigo such as vestibular schwannoma, cerebellar infarct, pathologies affecting the brainstem or vestibular nerve, MRA to detect stenosis or occlusion of the posterior circulation. Vestibular migraine is a possibility however that is a diagnosis of exclusion so need to have evaluation including ENT due to hearing changes with the vertigo. Will start low-dose Nortriptylline for headaches.  MRI of the brain, MRA of the head and neck Vestibular therapy ENT eval for vertigo and  hearing loss Will start Nortriptyline qhs for headache/migraine EKG before starting  Sarina Ill, MD  [facility] [address] [place], [place] [address]  Phone [phone] Fax [phone]

## 2014-11-09 ENCOUNTER — Telehealth: Payer: Self-pay | Admitting: Neurology

## 2014-11-09 NOTE — Telephone Encounter (Signed)
Dorian Pod from [facility] called wanting to let DR. Jaynee Eagles know that the patient is wont be scheduled until October. They are booked out until then and did not think the patient wanted to wait that long.  Dorian Pod can be reached @ [phone]

## 2014-11-09 NOTE — Telephone Encounter (Signed)
Ok I will  

## 2014-11-09 NOTE — Telephone Encounter (Signed)
Karen [name], can you ask Karen [name] to forward the referral to another ENT, maybe Dr. Ernesto Rutherford or whoever she thinks can see the patient soonest. Thank you.

## 2014-12-24 DIAGNOSIS — R51 Headache: Secondary | ICD-10-CM

## 2014-12-24 DIAGNOSIS — R519 Headache, unspecified: Secondary | ICD-10-CM | POA: Insufficient documentation

## 2015-01-05 ENCOUNTER — Ambulatory Visit (INDEPENDENT_AMBULATORY_CARE_PROVIDER_SITE_OTHER): Payer: BLUE CROSS/BLUE SHIELD | Admitting: Neurology

## 2015-01-05 ENCOUNTER — Encounter: Payer: Self-pay | Admitting: Neurology

## 2015-01-05 VITALS — BP 97/63 | HR 60 | Ht 68.0 in | Wt 128.4 lb

## 2015-01-05 DIAGNOSIS — G441 Vascular headache, not elsewhere classified: Secondary | ICD-10-CM | POA: Diagnosis not present

## 2015-01-05 DIAGNOSIS — R51 Headache: Secondary | ICD-10-CM | POA: Diagnosis not present

## 2015-01-05 DIAGNOSIS — R519 Headache, unspecified: Secondary | ICD-10-CM

## 2015-01-05 NOTE — Progress Notes (Signed)
[facility]    Provider:  Dr Jaynee Eagles Referring Provider: Leamon Arnt, MD Primary Care Physician:  Leamon Arnt, MD  CC: Vertigo and headache  Initial visit 11/08/2014: Karen [name] is a 44 y.o. female here as a referral from Dr. Radene Knee for vertigo and headache. She stopped taking the medication, it helped but she doesn't want to be a pill taker. She never scheduled the ENT referral for hearing loss or the MRI of the brain. The vertigo is improved. She is still having headache and memory and hearing loss. She can't remember things. She is forgetting what she is doing, takes her a minute but then she remembers. She has the right-sided headache, a heavy weight on the right side. Exercising makes it worse if she pushes herself. She feels like she is going to stroke. It is throbbing, pulsating like something is going to break inside her heas. She has light sensitivity. No nausea or vomiting. She has sensitivity of the scalp, numbness.    Initial visit: They came home from [place] on the 4th of April from vacation. Vertigo and Dizziness started on the 5th. Vertigo with room spinning and nausea, brief better when stay still . Hearing problems, hearing loss with feeling of clogged ears. She had the Dix-Hallpike maneuver completed in the PCP office with significant nausea, vertigo with head movement to the right. When she looked to the right. Got better with exercises she was given for BPPV. For a week and a half it improved after doing exercises. Had side effects from Meclizine and stopped taking it. Symptoms are worsening. She was doing well until when she went to the office 2 weeks later and had to file then had the symptoms again. She was at a sports game and she kept looking up and watching the game back and forth, made her feel very bad. She is exhausted afterwards. Now she has new onset headaches, she is getting headaches, sound and light makes headache worse. Pressure in the  whole head. Some light sensitivity. No history of migraines or headaches in the past, no FHx of headaches either. She has slurred her words with the vertigo and is having episodes of confusion with slurring . She doesn't have to make movements to precipitate the vertigo. She is having more confusion and memory problems. No sinus problems. No previous headaches. No family history of migraines. She has a lot of stress in her life. No focal weakness or vision changes. No stiff neck or systemic signs. Review of Systems: Patient complains of symptoms per HPI as well as the following symptoms: headache, no CP, no SOB. Pertinent negatives per HPI. All others negative.   History   Social History  . Marital Status: Married    Spouse Name: Glendell Docker  . Number of Children: 4  . Years of Education: Ba   Occupational History  . Administration     Social History Main Topics  . Smoking status: Never Smoker   . Smokeless tobacco: Not on file  . Alcohol Use: Yes     Comment: once weekly  . Drug Use: No  . Sexual Activity: Not on file   Other Topics Concern  . Not on file   Social History Narrative   Lives at home with husband and 4 kids   Caffeine use: tea occas    No family history on file.  Past Medical History  Diagnosis Date  . Varicose veins   . Urinary, incontinence, stress female   .  Lipoma     4-5 cm lipoma right upper quadrant (abdomen)  . Osteopenia   . Anemia     Past Surgical History  Procedure Laterality Date  . Cesarean section  2003, 2006  . Cholecystectomy  2005  . Endovenous ablation saphenous vein w/ laser  08-07-2012    left greater saphenous vein and stab phlebectomy 10-20 incisions left leg  by Curt Jews MD    Current Outpatient Prescriptions  Medication Sig Dispense Refill  . cholecalciferol (VITAMIN D) 1000 UNITS tablet Take 1,000 Units by mouth daily.    . Cyanocobalamin (VITAMIN B-12 CR PO) Take by mouth 2 (two) times daily.    . fish oil-omega-3 fatty  acids 1000 MG capsule Take 3 g by mouth daily.    Marland Kitchen glucosamine-chondroitin 500-400 MG tablet Take 1 tablet by mouth daily.    Marland Kitchen ibuprofen (ADVIL,MOTRIN) 600 MG tablet Take 600 mg by mouth every 6 (six) hours as needed for pain.    . nortriptyline (PAMELOR) 10 MG capsule Take 1 capsule (10 mg total) by mouth at bedtime. (Patient not taking: Reported on 01/05/2015) 30 capsule 3   No current facility-administered medications for this visit.    Allergies as of 01/05/2015 - Review Complete 01/05/2015  Allergen Reaction Noted  . Penicillins  11/28/2011    Vitals: BP 97/63 mmHg  Pulse 60  Ht 5\' 8"  (1.727 m)  Wt 128 lb 6.4 oz (58.242 kg)  BMI 19.53 kg/m2 Last Weight:  Wt Readings from Last 1 Encounters:  01/05/15 128 lb 6.4 oz (58.242 kg)   Last Height:   Ht Readings from Last 1 Encounters:  01/05/15 5\' 8"  (1.727 m)   Physical exam: Exam: Gen: NAD, conversant, well groomed                     CV: RRR, no MRG. No Carotid Bruits. No peripheral edema, warm, nontender Eyes: Conjunctivae clear without exudates or hemorrhage  Neuro: Detailed Neurologic Exam  Speech:    Speech is normal; fluent and spontaneous with normal comprehension.  Cognition:    The patient is oriented to person, place, and time;     recent and remote memory intact;     language fluent;     normal attention, concentration,     fund of knowledge Cranial Nerves:    The pupils are equal, round, and reactive to light. The fundi are normal and spontaneous venous pulsations are present. Visual fields are full to finger confrontation. Extraocular movements are intact. Trigeminal sensation is intact and the muscles of mastication are normal. The face is symmetric. The palate elevates in the midline. Hearing intact. Voice is normal. Shoulder shrug is normal. The tongue has normal motion without fasciculations.   Motor Observation:    No asymmetry, no atrophy, and no involuntary movements noted. Tone:    Normal muscle  tone.    Posture:    Posture is normal. normal erect    Strength:    Strength is V/V in the upper and lower limbs.       Assessment/Plan:  44 year old female who is here for vertigo associated with headache and hearing loss. Marye Round was positive in PCP office, she does not want to repeat. Vertigo happens with head movement and sudden movements but she is also having confusion, speech slurring, hearing loss. Appears peripheral but given associated symptoms need to rule out central cause of vertigo such as vestibular schwannoma, cerebellar infarct, pathologies affecting the brainstem or  vestibular nerve, MRA to detect stenosis or occlusion of the posterior circulation. Vestibular migraine is a possibility however that is a diagnosis of exclusion so need to have evaluation including ENT due to hearing changes with the vertigo. Will start low-dose Nortriptylline for headaches.  MRI of the brain, MRA of the head and neck - can't afford, will order CT of the head instead however patient understands this is less sensitive than an MRI Vestibular therapy - patient never scheduled ENT eval for vertigo and hearing loss - patient never scheduled, encouraged again Encouraged her to restart Nortriptyline qhs for  Since it helped EKG before starting   Sarina Ill, MD  [facility] [address] [place] [employer], [place] [address]  Phone [phone] Fax [phone]  A total of 15 minutes was spent face-to-face with this patient. Over half this time was spent on counseling patient on the migraine diagnosis and different diagnostic and therapeutic options available.

## 2015-01-05 NOTE — Patient Instructions (Addendum)
Overall you are doing fairly well but I do want to suggest a few things today:   Remember to drink plenty of fluid, eat healthy meals and do not skip any meals. Try to eat protein with a every meal and eat a healthy snack such as fruit or nuts in between meals. Try to keep a regular sleep-wake schedule and try to exercise daily, particularly in the form of walking, 20-30 minutes a day, if you can.   As far as your medications are concerned, I would like to suggest: Nortriptyline 10mg  at night  As far as diagnostic testing: CT of the head  As far as your medications are concerned, I would like to suggest: 1. Daily preventative medications Riboflavin 400 mg/day (Vitamin B2) (every day) Magnesium 400-600 mg (trigmagnesium dicitrate) daily (every day)  I would like to see you back in 3 months, sooner if we need to. Please call us with any interim questions, concerns, problems, updates or refill requests.   Please also call us for any test results so we can go over those with you on the phone.  My clinical assistant and will answer any of your questions and relay your messages to me and also relay most of my messages to you.   Our phone number is [phone]. We also have an after hours call service for urgent matters and there is a physician on-call for urgent questions. For any emergencies you know to call 911 or go to the nearest emergency room

## 2015-01-07 ENCOUNTER — Ambulatory Visit
Admission: RE | Admit: 2015-01-07 | Discharge: 2015-01-07 | Disposition: A | Discharge status: Home / Self Care | Payer: BLUE CROSS/BLUE SHIELD | Source: Ambulatory Visit | Attending: Neurology | Admitting: Neurology

## 2015-01-07 DIAGNOSIS — R519 Headache, unspecified: Secondary | ICD-10-CM

## 2015-01-07 DIAGNOSIS — R51 Headache: Secondary | ICD-10-CM

## 2015-01-07 DIAGNOSIS — G441 Vascular headache, not elsewhere classified: Secondary | ICD-10-CM

## 2015-01-10 ENCOUNTER — Telehealth: Payer: Self-pay | Admitting: *Deleted

## 2015-01-10 ENCOUNTER — Telehealth: Payer: Self-pay | Admitting: Neurology

## 2015-01-10 NOTE — Telephone Encounter (Signed)
Spoke with pt and she said she did not listen to her VM before she called back before and she is aware her CT head was normal after listening to VM. She asked for number to ENT Dr Jaynee Eagles referred her to. I gave her [facility], nose, and throat #: [phone]. She said she is going to call them now. Told her we close at 5pm and reopen at 8am if she had any more questions.

## 2015-01-10 NOTE — Telephone Encounter (Signed)
Left detailed VM with pt about normal CT head results. Gave [facility] phone number and office hours and told her Dr. Jaynee Eagles and I are not in the office on Friday's. Told her to call back if she had questions.

## 2015-01-10 NOTE — Telephone Encounter (Signed)
Patient is calling back about CT results.  Please call.

## 2015-02-01 ENCOUNTER — Telehealth: Payer: Self-pay | Admitting: Neurology

## 2015-02-01 DIAGNOSIS — R42 Dizziness and giddiness: Secondary | ICD-10-CM

## 2015-02-01 DIAGNOSIS — H9193 Unspecified hearing loss, bilateral: Secondary | ICD-10-CM

## 2015-02-01 DIAGNOSIS — F05 Delirium due to known physiological condition: Secondary | ICD-10-CM

## 2015-02-01 DIAGNOSIS — R410 Disorientation, unspecified: Secondary | ICD-10-CM

## 2015-02-01 DIAGNOSIS — R4781 Slurred speech: Secondary | ICD-10-CM

## 2015-02-01 NOTE — Telephone Encounter (Signed)
I would do brain with and without for vertigo and headaches

## 2015-02-01 NOTE — Telephone Encounter (Signed)
Pt called stating that she is having headaches from stress and every time she is in an argument, her headaches feels like pressure on both sides and it feels like pressure and it seems persistent. She states that her appetite has changed as well, she is less hungry. I advised the patient that I would confirm with Dr. Jaynee Eagles What type of scans he needs to have. Patient has a MRI Brain wo and MRA head wo but never scheduled them. I advised the patient that I would call her to set up her scans appt once Dr. Jaynee Eagles confirms the types of scans she should have. Patient can be reached @ [phone]

## 2015-02-02 ENCOUNTER — Telehealth: Payer: Self-pay | Admitting: Neurology

## 2015-02-02 NOTE — Telephone Encounter (Signed)
Patient would like to know if her results are back from when she went to the ENT doctor, she states that she had a hearing test. Please call and advise. Patient can be reached @ [phone]

## 2015-02-02 NOTE — Telephone Encounter (Signed)
Patient is scheduled for 8/17 @ [facility].  Thanks!!

## 2015-02-02 NOTE — Telephone Encounter (Signed)
Thank you Seth Bake!  Dr. Margarita Grizzle wanted to make you aware, thank you!

## 2015-02-02 NOTE — Telephone Encounter (Signed)
Spoke w/ pt. Told her to call ENT office for test results and have them fax a copy to [place] at [phone]. Told her Dr. Jaynee Eagles is out of town this week and will be back Monday. If she has new/worsening symptoms, contact her PCP or go to ER. She can also call our on-call doctor if it's after hours. If its an emergency, we can fit her in for appointment with work-in doctor. Pt verbalized understanding.

## 2015-02-16 ENCOUNTER — Other Ambulatory Visit: Payer: BLUE CROSS/BLUE SHIELD

## 2015-02-22 ENCOUNTER — Telehealth: Payer: Self-pay | Admitting: Neurology

## 2015-02-22 NOTE — Telephone Encounter (Signed)
Pt called and would like to know if its possible to do an EEG rather than an MRI. Please call and advise [phone], She would also like to now the cost of the EEG.

## 2015-02-22 NOTE — Telephone Encounter (Signed)
Karen [name] - please let patient know that unfortunately an EEG is not equivalent to an MRI of the brain. Thank you.

## 2015-02-23 NOTE — Telephone Encounter (Signed)
Called and left VM to let pt know an EEG is not equivalent to an MRI brain. They are two different tests. Gave [facility] phone number if she had further questions.

## 2015-03-02 ENCOUNTER — Other Ambulatory Visit: Payer: BLUE CROSS/BLUE SHIELD

## 2015-03-09 ENCOUNTER — Other Ambulatory Visit: Payer: Self-pay | Admitting: *Deleted

## 2015-03-09 ENCOUNTER — Telehealth: Payer: Self-pay | Admitting: Neurology

## 2015-03-09 ENCOUNTER — Ambulatory Visit (INDEPENDENT_AMBULATORY_CARE_PROVIDER_SITE_OTHER): Payer: BLUE CROSS/BLUE SHIELD

## 2015-03-09 DIAGNOSIS — H9193 Unspecified hearing loss, bilateral: Secondary | ICD-10-CM | POA: Diagnosis not present

## 2015-03-09 DIAGNOSIS — R4781 Slurred speech: Secondary | ICD-10-CM

## 2015-03-09 DIAGNOSIS — R42 Dizziness and giddiness: Secondary | ICD-10-CM | POA: Diagnosis not present

## 2015-03-09 DIAGNOSIS — F05 Delirium due to known physiological condition: Secondary | ICD-10-CM | POA: Diagnosis not present

## 2015-03-09 NOTE — Telephone Encounter (Signed)
Patient called this morning requesting xanax for her MRI today. Patient's MRI is @ 2:15PM but I advise the patient to check in @ 1:15PM to give enough time for her xanax to kick in.

## 2015-03-09 NOTE — Telephone Encounter (Signed)
Left detailed VM to let pt know Dr. Jaynee Eagles is okay for her to get some xanax before her MRI today. Told her to let ladies at the front check-in that she was there and I will bring her the xanax. Gave [facility] phone number if she had further questions.

## 2015-03-10 MED ORDER — GADOPENTETATE DIMEGLUMINE 469.01 MG/ML IV SOLN: 11.0000 mL | Freq: Once | INTRAVENOUS | Status: AC | PRN

## 2015-03-14 ENCOUNTER — Telehealth: Payer: Self-pay | Admitting: Neurology

## 2015-03-14 NOTE — Telephone Encounter (Signed)
Patient called requesting results of MRI

## 2015-03-14 NOTE — Telephone Encounter (Signed)
Karen [name] - can you let patient know that MRI of the brain was normal. thanks

## 2015-03-15 NOTE — Telephone Encounter (Signed)
Called and LVM for pt about normal MRI brain. Told her to call back if she had further questions. Gave [facility] phone number and office hours.

## 2015-04-13 ENCOUNTER — Telehealth: Payer: Self-pay | Admitting: *Deleted

## 2015-04-13 ENCOUNTER — Ambulatory Visit (INDEPENDENT_AMBULATORY_CARE_PROVIDER_SITE_OTHER): Payer: BLUE CROSS/BLUE SHIELD | Admitting: Neurology

## 2015-04-13 ENCOUNTER — Encounter: Payer: Self-pay | Admitting: Neurology

## 2015-04-13 VITALS — BP 104/60 | HR 62 | Resp 14 | Ht 68.0 in | Wt 127.6 lb

## 2015-04-13 DIAGNOSIS — G43109 Migraine with aura, not intractable, without status migrainosus: Secondary | ICD-10-CM | POA: Diagnosis not present

## 2015-04-13 DIAGNOSIS — G43809 Other migraine, not intractable, without status migrainosus: Secondary | ICD-10-CM

## 2015-04-13 MED ORDER — NORTRIPTYLINE HCL 10 MG PO CAPS: 10.0000 mg | ORAL_CAPSULE | Freq: Every day | ORAL | Status: DC

## 2015-04-13 NOTE — Patient Instructions (Signed)
Remember to drink plenty of fluid, eat healthy meals and do not skip any meals. Try to eat protein with a every meal and eat a healthy snack such as fruit or nuts in between meals. Try to keep a regular sleep-wake schedule and try to exercise daily, particularly in the form of walking, 20-30 minutes a day, if you can.   As far as your medications are concerned, I would like to suggest: Continue current medications  I would like to see you back in 6 months, sooner if we need to. Please call us with any interim questions, concerns, problems, updates or refill requests.   Please also call us for any test results so we can go over those with you on the phone.  My clinical assistant and will answer any of your questions and relay your messages to me and also relay most of my messages to you.   Our phone number is [phone]. We also have an after hours call service for urgent matters and there is a physician on-call for urgent questions. For any emergencies you know to call 911 or go to the nearest emergency room

## 2015-04-13 NOTE — Telephone Encounter (Signed)
Called and LMVM mobile for pt that thinning Dr. Cathren Laine schedule and asking if she would be willing to see CM/NP at 1400 today.  Pt returned call and would like to see Dr. Jaynee Eagles.

## 2015-04-13 NOTE — Progress Notes (Signed)
[facility]    Provider: Dr Jaynee Eagles Referring Provider: Leamon Arnt, MD Primary Care Physician: Leamon Arnt, MD  CC: Vertigo and headache  Initial visit 04/13/2015: Her symptoms are better on the Nortriptyline. She is taking one before bed. She started drinking coffee every day and she has a sprite for some caffeine. If she overeats she gets a headache. Discussed food triggers and other headache triggers. Encouraged good hydration and better sleep habits.  Initial visit 11/08/2014: Karen [name] is a 44 y.o. female here as a referral from Dr. Radene Knee for vertigo and headache. She stopped taking the medication, it helped but she doesn't want to be a pill taker. She never scheduled the ENT referral for hearing loss or the MRI of the brain. The vertigo is improved. She is still having headache and memory and hearing loss. She can't remember things. She is forgetting what she is doing, takes her a minute but then she remembers. She has the right-sided headache, a heavy weight on the right side. Exercising makes it worse if she pushes herself. She feels like she is going to stroke. It is throbbing, pulsating like something is going to break inside her heas. She has light sensitivity. No nausea or vomiting. She has sensitivity of the scalp, numbness.   Initial visit: They came home from [place] on the 4th of April from vacation. Vertigo and Dizziness started on the 5th. Vertigo with room spinning and nausea, brief better when stay still . Hearing problems, hearing loss with feeling of clogged ears. She had the Dix-Hallpike maneuver completed in the PCP office with significant nausea, vertigo with head movement to the right. When she looked to the right. Got better with exercises she was given for BPPV. For a week and a half it improved after doing exercises. Had side effects from Meclizine and stopped taking it. Symptoms are worsening. She was doing well until when she went to  the office 2 weeks later and had to file then had the symptoms again. She was at a sports game and she kept looking up and watching the game back and forth, made her feel very bad. She is exhausted afterwards. Now she has new onset headaches, she is getting headaches, sound and light makes headache worse. Pressure in the whole head. Some light sensitivity. No history of migraines or headaches in the past, no FHx of headaches either. She has slurred her words with the vertigo and is having episodes of confusion with slurring . She doesn't have to make movements to precipitate the vertigo. She is having more confusion and memory problems. No sinus problems. No previous headaches. No family history of migraines. She has a lot of stress in her life. No focal weakness or vision changes. No stiff neck or systemic signs. Review of Systems: Patient complains of symptoms per HPI as well as the following symptoms: headache, no CP, no SOB. Pertinent negatives per HPI. All others negative  Social History   Social History  . Marital Status: Married    Spouse Name: Glendell Docker  . Number of Children: 4  . Years of Education: Ba   Occupational History  . Administration     Social History Main Topics  . Smoking status: Never Smoker   . Smokeless tobacco: Not on file  . Alcohol Use: Yes     Comment: once weekly  . Drug Use: No  . Sexual Activity: Not on file   Other Topics Concern  . Not on file  Social History Narrative   Lives at home with husband and 4 kids   Caffeine use: tea occas    No family history on file.  Past Medical History  Diagnosis Date  . Varicose veins   . Urinary, incontinence, stress female   . Lipoma     4-5 cm lipoma right upper quadrant (abdomen)  . Osteopenia   . Anemia     Past Surgical History  Procedure Laterality Date  . Cesarean section  2003, 2006  . Cholecystectomy  2005  . Endovenous ablation saphenous vein w/ laser  08-07-2012    left greater saphenous vein  and stab phlebectomy 10-20 incisions left leg  by Curt Jews MD    Current Outpatient Prescriptions  Medication Sig Dispense Refill  . Cyanocobalamin (VITAMIN B-12 CR PO) Take by mouth 2 (two) times daily.    Marland Kitchen glucosamine-chondroitin 500-400 MG tablet Take 1 tablet by mouth daily.    . nortriptyline (PAMELOR) 10 MG capsule Take 1 capsule (10 mg total) by mouth at bedtime. 30 capsule 3  . cholecalciferol (VITAMIN D) 1000 UNITS tablet Take 1,000 Units by mouth daily.    . fish oil-omega-3 fatty acids 1000 MG capsule Take 3 g by mouth daily.    Marland Kitchen ibuprofen (ADVIL,MOTRIN) 600 MG tablet Take 600 mg by mouth every 6 (six) hours as needed for pain.     No current facility-administered medications for this visit.   Facility-Administered Medications Ordered in Other Visits  Medication Dose Route Frequency Provider Last Rate Last Dose  . gadopentetate dimeglumine (MAGNEVIST) injection 11 mL  11 mL Intravenous Once PRN Britt Bottom, MD        Allergies as of 04/13/2015 - Review Complete 04/13/2015  Allergen Reaction Noted  . Penicillins  11/28/2011    Vitals: BP 104/60 mmHg  Pulse 62  Resp 14  Ht 5\' 8"  (1.727 m)  Wt 127 lb 9.6 oz (57.879 kg)  BMI 19.41 kg/m2  LMP 03/25/2015 Last Weight:  Wt Readings from Last 1 Encounters:  04/13/15 127 lb 9.6 oz (57.879 kg)   Last Height:   Ht Readings from Last 1 Encounters:  04/13/15 5\' 8"  (1.727 m)     Physical exam: Exam: Gen: NAD, conversant, well groomed  CV: RRR, no MRG. No Carotid Bruits. No peripheral edema, warm, nontender Eyes: Conjunctivae clear without exudates or hemorrhage  Neuro: Detailed Neurologic Exam  Speech:  Speech is normal; fluent and spontaneous with normal comprehension.  Cognition:  The patient is oriented to person, place, and time;   recent and remote memory intact;   language fluent;   normal attention, concentration,   fund of knowledge Cranial Nerves:  The  pupils are equal, round, and reactive to light. The fundi are normal and spontaneous venous pulsations are present. Visual fields are full to finger confrontation. Extraocular movements are intact. Trigeminal sensation is intact and the muscles of mastication are normal. The face is symmetric. The palate elevates in the midline. Hearing intact. Voice is normal. Shoulder shrug is normal. The tongue has normal motion without fasciculations.   Motor Observation:  No asymmetry, no atrophy, and no involuntary movements noted. Tone:  Normal muscle tone.   Posture:  Posture is normal. normal erect   Strength:  Strength is V/V in the upper and lower limbs.    Assessment/Plan:  44 year old female who is here for vertigo associated with headache and hearing loss. Marye Round was positive in PCP office, she does not want to  repeat. Vertigo happens with head movement and sudden movements but she is also having confusion, speech slurring, hearing loss.  Continue low-dose Nortriptylline for headaches.  MRI of the brain, normal Vestibular therapy - patient never scheduled, she is doing much beter on the medication ENT eval for vertigo and hearing loss - patient never scheduled, encouraged again Encouraged her to continue Nortriptyline qhs  To prevent or relieve headaches, try the following: Cool Compress. Lie down and place a cool compress on your head.  Avoid headache triggers. If certain foods or odors seem to have triggered your migraines in the past, avoid them. A headache diary might help you identify triggers.  Include physical activity in your daily routine. Try a daily walk or other moderate aerobic exercise.  Manage stress. Find healthy ways to cope with the stressors, such as delegating tasks on your to-do list.  Practice relaxation techniques. Try deep breathing, yoga, massage and visualization.  Eat regularly. Eating regularly scheduled meals and maintaining a healthy  diet might help prevent headaches. Also, drink plenty of fluids.  Follow a regular sleep schedule. Sleep deprivation might contribute to headaches Consider biofeedback. With this mind-body technique, you learn to control certain bodily functions - such as muscle tension, heart rate and blood pressure - to prevent headaches or reduce headache pain.    Proceed to emergency room if you experience new or worsening symptoms or symptoms do not resolve, if you have new neurologic symptoms or if headache is severe, or for any concerning symptom.    Sarina Ill, MD  [facility] [address] [place], [place] [address]  Phone [phone] Fax [phone]  A total of 25 minutes was spent face-to-face with this patient. Over half this time was spent on counseling patient on the migraine diagnosis and different diagnostic and therapeutic options available.

## 2015-10-27 ENCOUNTER — Telehealth: Payer: Self-pay | Admitting: Neurology

## 2015-10-27 NOTE — Telephone Encounter (Signed)
error 

## 2016-06-30 ENCOUNTER — Other Ambulatory Visit: Payer: Self-pay | Admitting: Neurology

## 2016-06-30 DIAGNOSIS — G43809 Other migraine, not intractable, without status migrainosus: Secondary | ICD-10-CM

## 2016-06-30 DIAGNOSIS — G43109 Migraine with aura, not intractable, without status migrainosus: Principal | ICD-10-CM

## 2018-07-14 ENCOUNTER — Ambulatory Visit
Admission: RE | Admit: 2018-07-14 | Discharge: 2018-07-14 | Disposition: A | Discharge status: Home / Self Care | Payer: 59 | Source: Ambulatory Visit | Attending: Chiropractic Medicine | Admitting: Chiropractic Medicine

## 2018-07-14 ENCOUNTER — Other Ambulatory Visit: Payer: Self-pay | Admitting: Chiropractic Medicine

## 2018-07-14 DIAGNOSIS — M545 Low back pain, unspecified: Secondary | ICD-10-CM

## 2019-09-21 ENCOUNTER — Ambulatory Visit: Payer: 59

## 2021-08-19 ENCOUNTER — Emergency Department (HOSPITAL_BASED_OUTPATIENT_CLINIC_OR_DEPARTMENT_OTHER): Payer: No Typology Code available for payment source

## 2021-08-19 ENCOUNTER — Encounter (HOSPITAL_BASED_OUTPATIENT_CLINIC_OR_DEPARTMENT_OTHER): Payer: Self-pay

## 2021-08-19 ENCOUNTER — Other Ambulatory Visit: Payer: Self-pay

## 2021-08-19 ENCOUNTER — Observation Stay (HOSPITAL_BASED_OUTPATIENT_CLINIC_OR_DEPARTMENT_OTHER)
Admission: EM | Admit: 2021-08-19 | Discharge: 2021-08-20 | Disposition: A | Discharge status: Home / Self Care | Payer: No Typology Code available for payment source | Attending: Surgery | Admitting: Surgery

## 2021-08-19 DIAGNOSIS — Z20822 Contact with and (suspected) exposure to covid-19: Secondary | ICD-10-CM | POA: Diagnosis not present

## 2021-08-19 DIAGNOSIS — K3580 Unspecified acute appendicitis: Secondary | ICD-10-CM | POA: Diagnosis present

## 2021-08-19 DIAGNOSIS — K3533 Acute appendicitis with perforation and localized peritonitis, with abscess: Principal | ICD-10-CM | POA: Insufficient documentation

## 2021-08-19 DIAGNOSIS — R1031 Right lower quadrant pain: Secondary | ICD-10-CM | POA: Diagnosis present

## 2021-08-19 DIAGNOSIS — K37 Unspecified appendicitis: Secondary | ICD-10-CM

## 2021-08-19 LAB — CBC
HCT: 42.4 % (ref 36.0–46.0)
Hemoglobin: 14.2 g/dL (ref 12.0–15.0)
MCH: 29.7 pg (ref 26.0–34.0)
MCHC: 33.5 g/dL (ref 30.0–36.0)
MCV: 88.7 fL (ref 80.0–100.0)
Platelets: 154 10*3/uL (ref 150–400)
RBC: 4.78 MIL/uL (ref 3.87–5.11)
RDW: 14.7 % (ref 11.5–15.5)
WBC: 13.9 10*3/uL — ABNORMAL HIGH (ref 4.0–10.5)
nRBC: 0 % (ref 0.0–0.2)

## 2021-08-19 LAB — COMPREHENSIVE METABOLIC PANEL
ALT: 22 U/L (ref 0–44)
AST: 17 U/L (ref 15–41)
Albumin: 4.7 g/dL (ref 3.5–5.0)
Alkaline Phosphatase: 32 U/L — ABNORMAL LOW (ref 38–126)
Anion gap: 10 (ref 5–15)
BUN: 19 mg/dL (ref 6–20)
CO2: 22 mmol/L (ref 22–32)
Calcium: 9.9 mg/dL (ref 8.9–10.3)
Chloride: 102 mmol/L (ref 98–111)
Creatinine, Ser: 0.77 mg/dL (ref 0.44–1.00)
GFR, Estimated: >60 mL/min (ref >60)
Glucose, Bld: 121 mg/dL — ABNORMAL HIGH (ref 70–99)
Potassium: 4.6 mmol/L (ref 3.5–5.1)
Sodium: 134 mmol/L — ABNORMAL LOW (ref 135–145)
Total Bilirubin: 0.7 mg/dL (ref 0.3–1.2)
Total Protein: 7.4 g/dL (ref 6.5–8.1)

## 2021-08-19 LAB — URINALYSIS, ROUTINE W REFLEX MICROSCOPIC
Bilirubin Urine: NEGATIVE
Glucose, UA: NEGATIVE mg/dL
Hgb urine dipstick: NEGATIVE
Ketones, ur: >80 mg/dL — AB
Leukocytes,Ua: NEGATIVE
Nitrite: NEGATIVE
Specific Gravity, Urine: 1.025 (ref 1.005–1.030)
pH: 6 (ref 5.0–8.0)

## 2021-08-19 LAB — LIPASE, BLOOD: Lipase: 16 U/L (ref 11–51)

## 2021-08-19 LAB — PREGNANCY, URINE: Preg Test, Ur: NEGATIVE

## 2021-08-19 MED ORDER — ONDANSETRON HCL 4 MG/2ML IJ SOLN
4.0000 mg | Freq: Four times a day (QID) | INTRAMUSCULAR | Status: DC | PRN
  Administered 2021-08-19: 4 mg via INTRAVENOUS
  Filled 2021-08-19 (×2): qty 2

## 2021-08-19 MED ORDER — CIPROFLOXACIN IN D5W 400 MG/200ML IV SOLN
400.0000 mg | Freq: Once | INTRAVENOUS | Status: DC
  Filled 2021-08-19: qty 200

## 2021-08-19 MED ORDER — DEXTROSE-NACL 5-0.9 % IV SOLN: INTRAVENOUS | Status: DC

## 2021-08-19 MED ORDER — METRONIDAZOLE 500 MG/100ML IV SOLN
500.0000 mg | Freq: Two times a day (BID) | INTRAVENOUS | Status: DC
  Filled 2021-08-19: qty 100

## 2021-08-19 MED ORDER — IOHEXOL 300 MG/ML  SOLN
100.0000 mL | Freq: Once | INTRAMUSCULAR | Status: AC | PRN
  Administered 2021-08-19: 80 mL via INTRAVENOUS

## 2021-08-19 MED ORDER — HYDROMORPHONE HCL 1 MG/ML IJ SOLN
0.5000 mg | INTRAMUSCULAR | Status: DC | PRN
  Administered 2021-08-19 – 2021-08-20 (×2): 0.5 mg via INTRAVENOUS
  Filled 2021-08-19 (×3): qty 1

## 2021-08-19 MED ORDER — ONDANSETRON 4 MG PO TBDP: 4.0000 mg | ORAL_TABLET | Freq: Four times a day (QID) | ORAL | Status: DC | PRN

## 2021-08-19 MED ORDER — CIPROFLOXACIN IN D5W 400 MG/200ML IV SOLN
400.0000 mg | Freq: Two times a day (BID) | INTRAVENOUS | Status: DC
  Administered 2021-08-19: 400 mg via INTRAVENOUS
  Filled 2021-08-19: qty 200

## 2021-08-19 MED ORDER — METRONIDAZOLE 500 MG/100ML IV SOLN
500.0000 mg | Freq: Two times a day (BID) | INTRAVENOUS | Status: DC
  Administered 2021-08-20: 500 mg via INTRAVENOUS

## 2021-08-19 MED ORDER — KETOROLAC TROMETHAMINE 30 MG/ML IJ SOLN: 30.0000 mg | Freq: Four times a day (QID) | INTRAMUSCULAR | Status: DC | PRN

## 2021-08-19 MED ORDER — MORPHINE SULFATE (PF) 4 MG/ML IV SOLN
4.0000 mg | Freq: Once | INTRAVENOUS | Status: AC
  Administered 2021-08-19: 4 mg via INTRAVENOUS
  Filled 2021-08-19: qty 1

## 2021-08-19 MED ORDER — SODIUM CHLORIDE 0.9 % IV BOLUS
500.0000 mL | Freq: Once | INTRAVENOUS | Status: AC
  Administered 2021-08-19: 500 mL via INTRAVENOUS

## 2021-08-19 MED ORDER — HYDROMORPHONE HCL 1 MG/ML IJ SOLN
1.0000 mg | Freq: Once | INTRAMUSCULAR | Status: AC
  Administered 2021-08-19: 1 mg via INTRAVENOUS
  Filled 2021-08-19: qty 1

## 2021-08-19 NOTE — ED Provider Notes (Signed)
Patient arrives as a transfer from outside emergency department with concern for possible appendicitis.  Patient received pain medication prior to transfer.  She reportedly did not receive her antibiotics at the facility prior to coming by vehicle, therefore will need antibiotics here.  General surgeon paged at 924 pm - Dr Rosendo Gros to come see patient.  IV antibiotics ordered, cipro + flagyl, to be given.   Wyvonnia Dusky, MD 08/20/21 1037

## 2021-08-19 NOTE — Discharge Instructions (Addendum)
May shower beginning 08/21/21. Do not peel off or scrub skin glue. May allow warm soapy water to run over incision, then rinse and pat dry. Do not soak in any water (tubs, hot tubs, pools, lakes, oceans) for one week.   No lifting greater than 5 pounds for six weeks. May resume sexual activity when it is comfortable.   Pain regimen: take over-the-counter tylenol (acetaminophen) 1000mg  every six hours, the prescription ibuprofen (600mg ) every six hours and the robaxin (methocarbamol) 750mg  every six hours. With all three of these, you should be taking something every two hours. Example: tylenol ( acetaminophen) at 8am, ibuprofen at 10am, robaxin (methocarbamol) at 12pm, tylenol (acetaminophen) again at 2pm, ibuprofen again at 4pm, robaxin (methocarbamol) at 6pm. You also have a prescription for oxycodone, which should be taken if the tylenol (acetaminophen), ibuprofen, and robaxin (methocarbamol) are not enough to control your pain. You may take the oxycodone as frequently as every four hours as needed, but if you are taking the other medications as above, you should not need the oxycodone this frequently. You have also been given a prescription for colace (docusate) which is a stool softener. Please take this as prescribed because the oxycodone can cause constipation and the colace (docusate) will minimize or prevent constipation. Do not drive while taking or under the influence of the oxycodone as it is a narcotic medication.  Call the office at [phone] for temperature greater than 101.29F, worsening pain, redness or warmth at the incision site.  Please call [phone] to make an appointment for 2-3 weeks after surgery for wound check.

## 2021-08-19 NOTE — ED Notes (Signed)
Pt given Oral contrast to drink; Minimum 2 hour wait time for drinking. Earliest scan time will be 1800

## 2021-08-19 NOTE — ED Notes (Signed)
Pt given transfer packet and directed to go straight to [place] ED. This RN gave report to charge nurse at [facility] ED. IV left in upon departure per MD. Pt departed from ED with husband.

## 2021-08-19 NOTE — ED Notes (Signed)
Pt going POV with IV left in per Sedonia Small, MD

## 2021-08-19 NOTE — ED Notes (Signed)
Patient transported to CT 

## 2021-08-19 NOTE — ED Provider Notes (Signed)
[facility] EMERGENCY DEPT Provider Note   CSN: [id number] Arrival date & time: 08/19/21  1233     History  Chief Complaint  Patient presents with   Abdominal Pain    Karen [name] is a 51 y.o. female.  HPI  51 year old female with past medical history of cholecystectomy, previous C-sections, IBS presents emergency department concern for right lower quadrant abdominal pain.  Patient worked out this morning.  Following that she developed right lower quadrant abdominal pain that was initially radiating across her entire lower abdomen but now is more specifically right lower quadrant.  She describes it as a combination of sharp and crampy.  She had the urge to have a bowel movement, had multiple bowel movements of normal consistency.  No diarrhea bloody diarrhea.  Patient denies any genitourinary symptoms, no history of ovarian cysts/fibroids.  No recent fever.  She has had decrease in appetite but no vomiting.  Home Medications Prior to Admission medications   Medication Sig Start Date End Date Taking? Authorizing Provider  cholecalciferol (VITAMIN D) 1000 UNITS tablet Take 1,000 Units by mouth daily.    [provider]  Cyanocobalamin (VITAMIN B-12 CR PO) Take by mouth 2 (two) times daily.    [provider]  fish oil-omega-3 fatty acids 1000 MG capsule Take 3 g by mouth daily.    [provider]  glucosamine-chondroitin 500-400 MG tablet Take 1 tablet by mouth daily.    [provider]  ibuprofen (ADVIL,MOTRIN) 600 MG tablet Take 600 mg by mouth every 6 (six) hours as needed for pain.    [provider]  nortriptyline (PAMELOR) 10 MG capsule Take 1 capsule (10 mg total) by mouth at bedtime. 04/13/15   Melvenia Beam, MD      Allergies    Penicillins    Review of Systems   Review of Systems  Constitutional:  Positive for appetite change. Negative for fever.  Respiratory:  Negative for shortness of breath.    Cardiovascular:  Negative for chest pain.  Gastrointestinal:  Positive for abdominal pain and nausea. Negative for diarrhea and vomiting.  Skin:  Negative for rash.  Neurological:  Negative for headaches.   Physical Exam Updated Vital Signs BP 130/83    Pulse (!) 46    Temp 98 F (36.7 C) (Oral)    Resp 15    Ht 5' 7.5" (1.715 m)    Wt 59 kg    SpO2 100%    BMI 20.06 kg/m  Physical Exam Vitals and nursing note reviewed.  Constitutional:      General: She is not in acute distress.    Appearance: Normal appearance.  HENT:     Head: Normocephalic.     Mouth/Throat:     Mouth: Mucous membranes are moist.  Cardiovascular:     Rate and Rhythm: Normal rate.  Pulmonary:     Effort: Pulmonary effort is normal. No respiratory distress.  Abdominal:     Palpations: Abdomen is soft.     Tenderness: There is abdominal tenderness in the right lower quadrant. There is guarding. There is no rebound.  Skin:    General: Skin is warm.  Neurological:     Mental Status: She is alert and oriented to person, place, and time. Mental status is at baseline.  Psychiatric:        Mood and Affect: Mood normal.    ED Results / Procedures / Treatments   Labs (all labs ordered are listed, but only abnormal  results are displayed) Labs Reviewed  COMPREHENSIVE METABOLIC PANEL - Abnormal; Notable for the following components:      Result Value   Sodium 134 (*)    Glucose, Bld 121 (*)    Alkaline Phosphatase 32 (*)    All other components within normal limits  CBC - Abnormal; Notable for the following components:   WBC 13.9 (*)    All other components within normal limits  URINALYSIS, ROUTINE W REFLEX MICROSCOPIC - Abnormal; Notable for the following components:   Ketones, ur >80 (*)    Protein, ur TRACE (*)    All other components within normal limits  LIPASE, BLOOD  PREGNANCY, URINE    EKG EKG Interpretation  Date/Time:  Saturday August 19 2021 13:26:08 EST Ventricular Rate:  53 PR  Interval:    QRS Duration: 96 QT Interval:  466 QTC Calculation: 438 R Axis:   32 Text Interpretation: Atrial fibrillation RSR' in V1 or V2, probably normal variant Confirmed by Gerlene Fee [phone]) on 08/19/2021 3:02:41 PM  Radiology No results found.  Procedures Procedures    Medications Ordered in ED Medications  morphine (PF) 4 MG/ML injection 4 mg (4 mg Intravenous Given 08/19/21 1453)  sodium chloride 0.9 % bolus 500 mL (500 mLs Intravenous New Bag/Given 08/19/21 1454)  iohexol (OMNIPAQUE) 300 MG/ML solution 100 mL (80 mLs Intravenous Contrast Given 08/19/21 1444)    ED Course/ Medical Decision Making/ A&P                           Medical Decision Making Amount and/or Complexity of Data Reviewed Labs: ordered. Radiology: ordered.  Risk Prescription drug management.   50 year old female presents emergency department right lower quadrant abdominal pain.  Vitals are stable on arrival.  She is a mild leukocytosis on lab evaluation.  Tender to palpation of the right lower quadrant with voluntary guarding.  Plan for symptomatic treatment and CT of the abdomen pelvis for further evaluation. Patient signed out to Dr. Sedonia Small.        Final Clinical Impression(s) / ED Diagnoses Final diagnoses:  None    Rx / DC Orders ED Discharge Orders     None         Lorelle Gibbs, DO 08/19/21 1519

## 2021-08-19 NOTE — ED Provider Notes (Addendum)
°  Provider Note MRN:  [id number]  Arrival date & time: 08/19/21    ED Course and Medical Decision Making  Assumed care from Dr. Dina Rich at shift change.  History of IBS, presenting with right lower quadrant pain awaiting CT, would consider ultrasound to evaluate ovarian pathology more closely.  CT is equivocal, radiology recommending CT with oral contrast.  Repeat CT with some added details and is being read as concerning for appendicitis.  Discussed case with Dr. Rosendo Gros who recommends ED to ED transfer so that she can be evaluated and determine management plan from there.  Accepted for transfer by Dr. Laverta Baltimore of [facility] emergency department.  Patient and patient's husband wish to travel via private vehicle to [place] expenses.  Patient has completely normal vital signs, well-appearing, ambulating in the room.  No fever.  Appropriate for private vehicle transport straight to [facility].  .Critical Care Performed by: Maudie Flakes, MD Authorized by: Maudie Flakes, MD   Critical care provider statement:    Critical care time (minutes):  35   Critical care was necessary to treat or prevent imminent or life-threatening deterioration of the following conditions: Acute appendicitis.   Critical care was time spent personally by me on the following activities:  Development of treatment plan with patient or surrogate, discussions with consultants, evaluation of patient's response to treatment, examination of patient, ordering and review of laboratory studies, ordering and review of radiographic studies, ordering and performing treatments and interventions, pulse oximetry, re-evaluation of patient's condition and review of old charts  Final Clinical Impressions(s) / ED Diagnoses     ICD-10-CM   1. Appendicitis, unspecified appendicitis type  K37       ED Discharge Orders     None         Discharge Instructions      You likely have appendicitis.  We are transferring [facility] to the [facility] emergency  department to be evaluated by a surgeon.  Please go straight there.      Barth Kirks. Sedonia Small, [place] [email or web address]    Maudie Flakes, MD 08/19/21 2015    Maudie Flakes, MD 08/19/21 2015

## 2021-08-19 NOTE — H&P (Signed)
Karen [name] is an 51 y.o. female.   Chief Complaint: abdominal pain HPI: Pt is a 40F with abdominal pain.  Pt states pain began this AM after working out and [employer].  Pt states the pain has been in the RLQ area.   Pt with some nausea and no emesis, no diarrhea.  Pt was seen at an outside ED and underwent CT scan.  CT was equiv for appendicitis, but there was a appendicolith seen.  Pt with leukocytosis.    She was transferred to [facility] for further evaluation and treatment.  Past Medical History:  Diagnosis Date   Anemia    Lipoma    4-5 cm lipoma right upper quadrant (abdomen)   Osteopenia    Urinary, incontinence, stress female    Varicose veins     Past Surgical History:  Procedure Laterality Date   CESAREAN SECTION  2003, 2006   CHOLECYSTECTOMY  2005   ENDOVENOUS ABLATION SAPHENOUS VEIN W/ LASER  08-07-2012   left greater saphenous vein and stab phlebectomy 10-20 incisions left leg  by Curt Jews MD    History reviewed. No pertinent family history. Social History:  reports that she has never smoked. She does not have any smokeless tobacco history on file. She reports current alcohol use. She reports that she does not use drugs.  Allergies:  Allergies  Allergen Reactions   Penicillins     (Not in a hospital admission)   Results for orders placed or performed during the hospital encounter of 08/19/21 (from the past 48 hour(s))  Lipase, blood     Status: None   Collection Time: 08/19/21  1:00 PM  Result Value Ref Range   Lipase 16 11 - 51 U/L    Comment: Performed at [facility], [address] [place], [place] [address]  Comprehensive metabolic panel     Status: Abnormal   Collection Time: 08/19/21  1:00 PM  Result Value Ref Range   Sodium 134 (L) 135 - 145 mmol/L   Potassium 4.6 3.5 - 5.1 mmol/L   Chloride 102 98 - 111 mmol/L   CO2 22 22 - 32 mmol/L   Glucose, Bld 121 (H) 70 - 99 mg/dL    Comment: Glucose reference range applies only to  samples taken after fasting for at least 8 hours.   BUN 19 6 - 20 mg/dL   Creatinine, Ser 0.77 0.44 - 1.00 mg/dL   Calcium 9.9 8.9 - 10.3 mg/dL   Total Protein 7.4 6.5 - 8.1 g/dL   Albumin 4.7 3.5 - 5.0 g/dL   AST 17 15 - 41 U/L   ALT 22 0 - 44 U/L   Alkaline Phosphatase 32 (L) 38 - 126 U/L   Total Bilirubin 0.7 0.3 - 1.2 mg/dL   GFR, Estimated >60 >60 mL/min    Comment: (NOTE) Calculated using the CKD-EPI Creatinine Equation (2021)    Anion gap 10 5 - 15    Comment: Performed at [facility], [address], [place], [place] [address]  CBC     Status: Abnormal   Collection Time: 08/19/21  1:00 PM  Result Value Ref Range   WBC 13.9 (H) 4.0 - 10.5 K/uL   RBC 4.78 3.87 - 5.11 MIL/uL   Hemoglobin 14.2 12.0 - 15.0 g/dL   HCT 42.4 36.0 - 46.0 %   MCV 88.7 80.0 - 100.0 fL   MCH 29.7 26.0 - 34.0 pg   MCHC 33.5 30.0 - 36.0 g/dL  RDW 14.7 11.5 - 15.5 %   Platelets 154 150 - 400 K/uL   nRBC 0.0 0.0 - 0.2 %    Comment: Performed at [facility], [address] [place], [place] [address]  Urinalysis, Routine w reflex microscopic Urine, Clean Catch     Status: Abnormal   Collection Time: 08/19/21  1:57 PM  Result Value Ref Range   Color, Urine YELLOW YELLOW   APPearance CLEAR CLEAR   Specific Gravity, Urine 1.025 1.005 - 1.030   pH 6.0 5.0 - 8.0   Glucose, UA NEGATIVE NEGATIVE mg/dL   Hgb urine dipstick NEGATIVE NEGATIVE   Bilirubin Urine NEGATIVE NEGATIVE   Ketones, ur >80 (A) NEGATIVE mg/dL   Protein, ur TRACE (A) NEGATIVE mg/dL   Nitrite NEGATIVE NEGATIVE   Leukocytes,Ua NEGATIVE NEGATIVE    Comment: Performed at [facility], [address] [place], [place] [address]  Pregnancy, urine     Status: None   Collection Time: 08/19/21  1:57 PM  Result Value Ref Range   Preg Test, Ur NEGATIVE NEGATIVE    Comment:        THE SENSITIVITY OF THIS METHODOLOGY IS >20 mIU/mL. Performed at [facility], [address] [place], [place] [address]    CT ABDOMEN PELVIS WO CONTRAST  Result Date: 08/19/2021 CLINICAL DATA:  Right lower quadrant abdominal pain. Nonvisualization of appendix on imaging earlier today. EXAM: CT ABDOMEN AND PELVIS WITHOUT CONTRAST TECHNIQUE: Multidetector CT imaging of the abdomen and pelvis was performed following the standard protocol without IV contrast. RADIATION DOSE REDUCTION: This exam was performed according to the departmental dose-optimization program which includes automated exposure control, adjustment of the mA and/or kV according to patient size and/or use of iterative reconstruction technique. COMPARISON:  CT scan earlier today. FINDINGS: Lower chest: Not visualized. Hepatobiliary: Liver was not entirely included on the study. Prior cholecystectomy. No intrahepatic or extrahepatic biliary dilation. Pancreas: Not completely included on the study. Spleen: Not completely included on the study. Adrenals/Urinary Tract: Adrenal glands not included in the field of view. Lower kidneys unremarkable. No hydroureter The urinary bladder appears normal for the degree of distention. Stomach/Bowel: Moderate gastric distention with contrast material although the entire stomach is not been visualized. There is no small bowel dilatation. Contrast material has migrated through to the level of the proximal transverse colon. Terminal ileum unremarkable. As noted on the CT earlier today, there is a calcified stone near the tip of the cecum that is probably in the base of the appendix (see coronal images 41-46 of series 4. On axial imaging, there is a structure posterior to the cecum that arises in the same area as the appendicoliths, measuring about 9 mm in diameter with attenuation higher than adjacent small bowel loops and may contain the stone but is not opacified with oral contrast (see axial imaging images 35-44 of series 2). This could potentially represent a dilated appendix. Lack of  intravenous contrast limits assessment due to the decreased contrast resolution. Prominent stool volume raises the question of clinical constipation. Vascular/Lymphatic: No abdominal aortic aneurysm. There is no gastrohepatic or hepatoduodenal ligament lymphadenopathy. No retroperitoneal or mesenteric lymphadenopathy. No pelvic lymphadenopathy Reproductive: Not well seen on noncontrast imaging. Other: Small amount of free fluid is seen in the cul-de-sac. Musculoskeletal: No worrisome lytic or sclerotic osseous abnormality. IMPRESSION: 1. Calcified stone near the tip of the cecum is probably in the base of the appendix. On axial imaging, there is a tubular structure posterior to the  cecum that appears to arise from the stone, measuring about 9 mm in diameter having attenuation minimally higher than adjacent unopacified small bowel loops. Although the cecum and ascending colon are well opacified, the appendix is not opacified and the probable appendicoliths may be occluding the lumen at the base of the appendix. Appendix cannot be discretely identified, likely due to the lack of intravenous contrast and lack of mesenteric/intrapelvic fat in this patient. But, given the apparent appendicolith and appearance of a tubular structure arising from the region of the appendicolith and potentially filled with inspissated material, features are considered concerning for acute appendicitis although this is not a definite finding on the current study. Close clinical correlation will be required. 2. The ovarian cyst documented on the CT earlier today is not well seen on noncontrast imaging. 3. Small amount of free fluid in the cul-de-sac. Findings discussed with Dr. Sedonia Small at the time of study interpretation. Electronically Signed   By: Misty Stanley M.D.   On: 08/19/2021 18:47   CT Abdomen Pelvis W Contrast  Result Date: 08/19/2021 CLINICAL DATA:  Acute abdominal pain.  Right lower quadrant pain. EXAM: CT ABDOMEN AND PELVIS  WITH CONTRAST TECHNIQUE: Multidetector CT imaging of the abdomen and pelvis was performed using the standard protocol following bolus administration of intravenous contrast. RADIATION DOSE REDUCTION: This exam was performed according to the departmental dose-optimization program which includes automated exposure control, adjustment of the mA and/or kV according to patient size and/or use of iterative reconstruction technique. CONTRAST:  66mL OMNIPAQUE IOHEXOL 300 MG/ML  SOLN COMPARISON:  10/07/2003. FINDINGS: Lower chest: Lung bases are clear. Hepatobiliary: Previous cholecystectomy. Liver parenchyma is normal. Pancreas: Normal Spleen: Normal Adrenals/Urinary Tract: Adrenal glands are normal. Kidneys are normal. Bladder is normal. Stomach/Bowel: Stomach and small intestine are normal. Patient is quite thin and there is a paucity of fat. The bowel does not contain any oral contrast in the bowel loops are quite clustered. Definite identification of the appendix is not achieved. There is an 8 mm calcification in the right central anterior pelvis that could possibly represent an appendicolith. If further clarity is desired, 1 might consider repeating the scan after oral contrast administration. No colon pathology is visible. Normal amount of fecal matter and gas. Vascular/Lymphatic: Aorta and IVC are normal.  No adenopathy. Reproductive: No significant uterine finding. There are probably functional cyst associated with the right ovary, the largest 2.4 cm. There is a small amount of free fluid in the pelvic cul-de-sac, often seen and females of this age. Other: No free intraperitoneal air. Musculoskeletal: Scoliosis of the spine convex to the left, Cobb angle estimated at about 30 degrees. 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 IMPRESSION: The appendix cannot be specifically identified. The patient is quite thin and there is a paucity of intra-abdominal fat.  No oral contrast was administered. There is a calcification in the anterior central pelvis just to the right of midline which could possibly represent an appendicolith. I do not see definite inflamed bowel associated with that. If concern is high, one could consider repeat scan after oral contrast administration. Probable functional ovarian cysts on the right, the largest 2.4 cm. Small amount of free fluid in the pelvis, often seen in females of this age. No follow-up imaging recommended. Note: This recommendation does not apply to premenarchal patients and to those with increased risk (genetic, family history, elevated tumor markers or other high-risk factors) of ovarian cancer. Reference: Bernette Redbird  2020 Feb; 17(2):248-254 Scoliosis of the spine convex to left, Cobb angle estimated at 30 degrees. Previous cholecystectomy. Electronically Signed   By: Nelson Chimes M.D.   On: 08/19/2021 15:15    Review of Systems  Constitutional: Negative.   HENT: Negative.    Respiratory: Negative.    Cardiovascular: Negative.   Gastrointestinal:  Positive for abdominal pain.  Neurological: Negative.   All other systems reviewed and are negative.  Blood pressure 104/69, pulse (!) 58, temperature 99 F (37.2 C), temperature source Oral, resp. rate 16, height 5' 7.5" (1.715 m), weight 59 kg, SpO2 97 %. Physical Exam Constitutional:      Appearance: She is well-developed.     Comments: Conversant No acute distress  HENT:     Head: Normocephalic and atraumatic.  Eyes:     General: Lids are normal. No scleral icterus.    Pupils: Pupils are equal, round, and reactive to light.     Comments: Pupils are equal round and reactive No lid lag Moist conjunctiva  Neck:     Thyroid: No thyromegaly.     Trachea: No tracheal tenderness.     Comments: No cervical lymphadenopathy Cardiovascular:     Rate and Rhythm: Normal rate and regular rhythm.     Heart sounds: No murmur heard. Pulmonary:     Effort: Pulmonary effort is  normal.     Breath sounds: Normal breath sounds. No wheezing or rales.  Abdominal:     Tenderness: There is abdominal tenderness. There is no guarding or rebound. Positive signs include Rovsing's sign and McBurney's sign. Negative signs include psoas sign and obturator sign.     Hernia: No hernia is present.  Musculoskeletal:     Cervical back: Normal range of motion and neck supple.  Skin:    General: Skin is warm.     Findings: No rash.     Nails: There is no clubbing.     Comments: Normal skin turgor  Neurological:     Mental Status: She is alert and oriented to person, place, and time.     Comments: Normal gait and station  Psychiatric:        Mood and Affect: Mood normal.        Thought Content: Thought content normal.        Judgment: Judgment normal.     Comments: Appropriate affect     Assessment/Plan 53F with acute appendicitis.  Will admit, keep NPO, and con't IV abx I had a long discussion with the patient and her husband.  She is willing to proceed to the OR for lap appy with Dr. Bobbye Morton in the AM. I will have her go over the risks and benefits of the procedure in the AM.   Ralene Ok, MD 08/19/2021, 10:18 PM

## 2021-08-19 NOTE — ED Triage Notes (Signed)
Patient reports right lower quadrant pain x 2 hours. Pt reports nausea no vomiting and no diarrhea.

## 2021-08-20 ENCOUNTER — Observation Stay (HOSPITAL_COMMUNITY): Payer: No Typology Code available for payment source | Admitting: Anesthesiology

## 2021-08-20 ENCOUNTER — Other Ambulatory Visit: Payer: Self-pay

## 2021-08-20 ENCOUNTER — Encounter (HOSPITAL_COMMUNITY)
Admission: EM | Disposition: A | Discharge status: Home / Self Care | Payer: Self-pay | Source: Home / Self Care | Attending: Emergency Medicine

## 2021-08-20 ENCOUNTER — Encounter (HOSPITAL_COMMUNITY): Payer: Self-pay

## 2021-08-20 ENCOUNTER — Observation Stay (HOSPITAL_BASED_OUTPATIENT_CLINIC_OR_DEPARTMENT_OTHER): Payer: No Typology Code available for payment source | Admitting: Anesthesiology

## 2021-08-20 DIAGNOSIS — K3580 Unspecified acute appendicitis: Secondary | ICD-10-CM | POA: Diagnosis not present

## 2021-08-20 HISTORY — PX: LAPAROSCOPIC APPENDECTOMY: SHX408

## 2021-08-20 LAB — RESP PANEL BY RT-PCR (FLU A&B, COVID) ARPGX2
Influenza A by PCR: NEGATIVE
Influenza B by PCR: NEGATIVE
SARS Coronavirus 2 by RT PCR: NEGATIVE

## 2021-08-20 SURGERY — IR WITH ANESTHESIA
Anesthesia: General

## 2021-08-20 SURGERY — APPENDECTOMY, LAPAROSCOPIC
Anesthesia: General

## 2021-08-20 MED ORDER — FENTANYL CITRATE (PF) 100 MCG/2ML IJ SOLN: 25.0000 ug | INTRAMUSCULAR | Status: DC | PRN

## 2021-08-20 MED ORDER — LIDOCAINE HCL (CARDIAC) PF 100 MG/5ML IV SOSY
PREFILLED_SYRINGE | INTRAVENOUS | Status: DC | PRN
  Administered 2021-08-20: 60 mg via INTRAVENOUS

## 2021-08-20 MED ORDER — PROPOFOL 10 MG/ML IV BOLUS
INTRAVENOUS | Status: AC
  Filled 2021-08-20: qty 20

## 2021-08-20 MED ORDER — PROPOFOL 10 MG/ML IV BOLUS
INTRAVENOUS | Status: DC | PRN
  Administered 2021-08-20: 130 mg via INTRAVENOUS

## 2021-08-20 MED ORDER — ACETAMINOPHEN 10 MG/ML IV SOLN
INTRAVENOUS | Status: AC
  Filled 2021-08-20: qty 100

## 2021-08-20 MED ORDER — BUPIVACAINE-EPINEPHRINE 0.25% -1:200000 IJ SOLN
INTRAMUSCULAR | Status: DC | PRN
  Administered 2021-08-20: 8 mL
  Administered 2021-08-20: 12 mL

## 2021-08-20 MED ORDER — OXYCODONE HCL 5 MG PO TABS: 5.0000 mg | ORAL_TABLET | ORAL | 0 refills | Status: DC | PRN

## 2021-08-20 MED ORDER — ACETAMINOPHEN 10 MG/ML IV SOLN
INTRAVENOUS | Status: DC | PRN
  Administered 2021-08-20: 1000 mg via INTRAVENOUS

## 2021-08-20 MED ORDER — ROCURONIUM 10MG/ML (10ML) SYRINGE FOR MEDFUSION PUMP - OPTIME
INTRAVENOUS | Status: DC | PRN
  Administered 2021-08-20: 50 mg via INTRAVENOUS

## 2021-08-20 MED ORDER — FENTANYL CITRATE (PF) 250 MCG/5ML IJ SOLN
INTRAMUSCULAR | Status: DC | PRN
  Administered 2021-08-20 (×2): 50 ug via INTRAVENOUS

## 2021-08-20 MED ORDER — CHLORHEXIDINE GLUCONATE 0.12 % MT SOLN
15.0000 mL | Freq: Once | OROMUCOSAL | Status: AC
  Administered 2021-08-20: 15 mL via OROMUCOSAL
  Filled 2021-08-20: qty 15

## 2021-08-20 MED ORDER — FENTANYL CITRATE (PF) 250 MCG/5ML IJ SOLN
INTRAMUSCULAR | Status: AC
  Filled 2021-08-20: qty 5

## 2021-08-20 MED ORDER — LACTATED RINGERS IV SOLN: INTRAVENOUS | Status: DC

## 2021-08-20 MED ORDER — METHOCARBAMOL 750 MG PO TABS: 750.0000 mg | ORAL_TABLET | Freq: Four times a day (QID) | ORAL | 1 refills | Status: DC

## 2021-08-20 MED ORDER — KETOROLAC TROMETHAMINE 30 MG/ML IJ SOLN
INTRAMUSCULAR | Status: DC | PRN
Start: 2021-08-20 — End: 2021-08-20
  Administered 2021-08-20: 30 mg via INTRAVENOUS

## 2021-08-20 MED ORDER — ACETAMINOPHEN 10 MG/ML IV SOLN: 1000.0000 mg | Freq: Once | INTRAVENOUS | Status: DC | PRN

## 2021-08-20 MED ORDER — 0.9 % SODIUM CHLORIDE (POUR BTL) OPTIME
TOPICAL | Status: DC | PRN
  Administered 2021-08-20: 1000 mL

## 2021-08-20 MED ORDER — SUGAMMADEX SODIUM 200 MG/2ML IV SOLN
INTRAVENOUS | Status: DC | PRN
  Administered 2021-08-20: 200 mg via INTRAVENOUS

## 2021-08-20 MED ORDER — IBUPROFEN 600 MG PO TABS: 600.0000 mg | ORAL_TABLET | Freq: Four times a day (QID) | ORAL | 1 refills | Status: DC

## 2021-08-20 MED ORDER — MIDAZOLAM HCL 2 MG/2ML IJ SOLN
INTRAMUSCULAR | Status: AC
  Filled 2021-08-20: qty 2

## 2021-08-20 MED ORDER — ORAL CARE MOUTH RINSE: 15.0000 mL | Freq: Once | OROMUCOSAL | Status: AC

## 2021-08-20 MED ORDER — DOCUSATE SODIUM 100 MG PO CAPS: 100.0000 mg | ORAL_CAPSULE | Freq: Two times a day (BID) | ORAL | 2 refills | Status: AC

## 2021-08-20 MED ORDER — LACTATED RINGERS IV SOLN: INTRAVENOUS | Status: DC | PRN

## 2021-08-20 MED ORDER — BUPIVACAINE-EPINEPHRINE (PF) 0.25% -1:200000 IJ SOLN
INTRAMUSCULAR | Status: AC
  Filled 2021-08-20: qty 30

## 2021-08-20 MED ORDER — ONDANSETRON HCL 4 MG/2ML IJ SOLN
INTRAMUSCULAR | Status: DC | PRN
  Administered 2021-08-20: 4 mg via INTRAVENOUS

## 2021-08-20 MED ORDER — ACETAMINOPHEN 500 MG PO TABS: 1000.0000 mg | ORAL_TABLET | Freq: Four times a day (QID) | ORAL | 1 refills | Status: AC | PRN

## 2021-08-20 MED ORDER — DEXAMETHASONE SODIUM PHOSPHATE 10 MG/ML IJ SOLN
INTRAMUSCULAR | Status: DC | PRN
Start: 2021-08-20 — End: 2021-08-20
  Administered 2021-08-20: 10 mg via INTRAVENOUS

## 2021-08-20 MED ORDER — MIDAZOLAM HCL 2 MG/2ML IJ SOLN
INTRAMUSCULAR | Status: DC | PRN
  Administered 2021-08-20: 2 mg via INTRAVENOUS

## 2021-08-20 MED ORDER — PROMETHAZINE HCL 25 MG/ML IJ SOLN: 6.2500 mg | INTRAMUSCULAR | Status: DC | PRN

## 2021-08-20 SURGICAL SUPPLY — 45 items
APPLIER CLIP ROT 10 11.4 M/L (STAPLE)
BAG COUNTER SPONGE SURGICOUNT (BAG) ×2 IMPLANT
BLADE CLIPPER SURG (BLADE) ×1 IMPLANT
CANISTER SUCT 3000ML PPV (MISCELLANEOUS) IMPLANT
CHLORAPREP W/TINT 26 (MISCELLANEOUS) ×2 IMPLANT
CLIP APPLIE ROT 10 11.4 M/L (STAPLE) IMPLANT
COVER SURGICAL LIGHT HANDLE (MISCELLANEOUS) ×2 IMPLANT
CUTTER FLEX LINEAR 45M (STAPLE) ×2 IMPLANT
DERMABOND ADHESIVE PROPEN (GAUZE/BANDAGES/DRESSINGS) ×1
DERMABOND ADVANCED (GAUZE/BANDAGES/DRESSINGS) ×1
DERMABOND ADVANCED .7 DNX12 (GAUZE/BANDAGES/DRESSINGS) IMPLANT
DERMABOND ADVANCED .7 DNX6 (GAUZE/BANDAGES/DRESSINGS) ×1 IMPLANT
ELECT CAUTERY BLADE 6.4 (BLADE) ×2 IMPLANT
ELECT REM PT RETURN 9FT ADLT (ELECTROSURGICAL) ×2
ELECTRODE REM PT RTRN 9FT ADLT (ELECTROSURGICAL) ×1 IMPLANT
ENDOLOOP SUT PDS II  0 18 (SUTURE)
ENDOLOOP SUT PDS II 0 18 (SUTURE) IMPLANT
GLOVE SURG ENC MOIS LTX SZ6.5 (GLOVE) ×2 IMPLANT
GLOVE SURG UNDER POLY LF SZ6 (GLOVE) ×2 IMPLANT
GOWN STRL REUS W/ TWL LRG LVL3 (GOWN DISPOSABLE) ×3 IMPLANT
GOWN STRL REUS W/TWL LRG LVL3 (GOWN DISPOSABLE) ×6
KIT BASIN OR (CUSTOM PROCEDURE TRAY) ×2 IMPLANT
KIT TURNOVER KIT B (KITS) ×2 IMPLANT
NDL INSUFFLATION 14GA 120MM (NEEDLE) IMPLANT
NEEDLE INSUFFLATION 14GA 120MM (NEEDLE) IMPLANT
NS IRRIG 1000ML POUR BTL (IV SOLUTION) ×2 IMPLANT
PENCIL BUTTON HOLSTER BLD 10FT (ELECTRODE) ×2 IMPLANT
POUCH SPECIMEN RETRIEVAL 10MM (ENDOMECHANICALS) ×2 IMPLANT
RELOAD 45 VASCULAR/THIN (ENDOMECHANICALS) ×2 IMPLANT
RELOAD STAPLE 45 2.5 WHT GRN (ENDOMECHANICALS) IMPLANT
RELOAD STAPLE 45 3.5 BLU ETS (ENDOMECHANICALS) IMPLANT
RELOAD STAPLE TA45 3.5 REG BLU (ENDOMECHANICALS) ×2 IMPLANT
SCISSORS LAP 5X35 DISP (ENDOMECHANICALS) IMPLANT
SET IRRIG TUBING LAPAROSCOPIC (IRRIGATION / IRRIGATOR) IMPLANT
SET TUBE SMOKE EVAC HIGH FLOW (TUBING) ×2 IMPLANT
SHEARS HARMONIC ACE PLUS 36CM (ENDOMECHANICALS) IMPLANT
SLEEVE ENDOPATH XCEL 5M (ENDOMECHANICALS) ×2 IMPLANT
SUT MNCRL AB 4-0 PS2 18 (SUTURE) ×2 IMPLANT
SUT VICRYL 0 UR6 27IN ABS (SUTURE) ×1 IMPLANT
TOWEL GREEN STERILE (TOWEL DISPOSABLE) ×2 IMPLANT
TOWEL GREEN STERILE FF (TOWEL DISPOSABLE) ×2 IMPLANT
TRAY LAPAROSCOPIC MC (CUSTOM PROCEDURE TRAY) ×2 IMPLANT
TROCAR XCEL BLUNT TIP 100MML (ENDOMECHANICALS) IMPLANT
TROCAR XCEL NON-BLD 5MMX100MML (ENDOMECHANICALS) ×2 IMPLANT
WATER STERILE IRR 1000ML POUR (IV SOLUTION) ×2 IMPLANT

## 2021-08-20 NOTE — Anesthesia Preprocedure Evaluation (Signed)
Anesthesia Evaluation  Patient identified by MRN, date of birth, ID band Patient awake    Reviewed: Allergy & Precautions, NPO status , Patient's Chart, lab work & pertinent test results  Airway Mallampati: II  TM Distance: >3 FB Neck ROM: Full    Dental no notable dental hx.    Pulmonary neg pulmonary ROS,    Pulmonary exam normal        Cardiovascular negative cardio ROS   Rhythm:Regular Rate:Normal     Neuro/Psych  Headaches, negative psych ROS   GI/Hepatic Neg liver ROS, Acute appendicitis    Endo/Other  negative endocrine ROS  Renal/GU negative Renal ROS  negative genitourinary   Musculoskeletal negative musculoskeletal ROS (+)   Abdominal Normal abdominal exam  (+)   Peds  Hematology  (+) Blood dyscrasia, anemia ,   Anesthesia Other Findings   Reproductive/Obstetrics                             Anesthesia Physical Anesthesia Plan  ASA: 2  Anesthesia Plan: General   Post-op Pain Management:    Induction: Intravenous  PONV Risk Score and Plan: 3 and Ondansetron, Dexamethasone, Midazolam and Treatment may vary due to age or medical condition  Airway Management Planned: Mask and Oral ETT  Additional Equipment: None  Intra-op Plan:   Post-operative Plan: Extubation in OR  Informed Consent: I have reviewed the patients History and Physical, chart, labs and discussed the procedure including the risks, benefits and alternatives for the proposed anesthesia with the patient or authorized representative who has indicated his/her understanding and acceptance.     Dental advisory given  Plan Discussed with: CRNA  Anesthesia Plan Comments: (Lab Results      Component                Value               Date                      WBC                      13.9 (H)            08/19/2021                HGB                      14.2                08/19/2021                HCT                       42.4                08/19/2021                MCV                      88.7                08/19/2021                PLT                      154  08/19/2021           Lab Results      Component                Value               Date                      NA                       134 (L)             08/19/2021                K                        4.6                 08/19/2021                CO2                      22                  08/19/2021                GLUCOSE                  121 (H)             08/19/2021                BUN                      19                  08/19/2021                CREATININE               0.77                08/19/2021                CALCIUM                  9.9                 08/19/2021                GFRNONAA                 >60                 08/19/2021          )        Anesthesia Quick Evaluation

## 2021-08-20 NOTE — Anesthesia Postprocedure Evaluation (Signed)
Anesthesia Post Note  Patient: ARABELLE BOLLIG  Procedure(s) Performed: APPENDECTOMY LAPAROSCOPIC     Patient location during evaluation: PACU Anesthesia Type: General Level of consciousness: awake and alert Pain management: pain level controlled Vital Signs Assessment: post-procedure vital signs reviewed and stable Respiratory status: spontaneous breathing, nonlabored ventilation, respiratory function stable and patient connected to nasal cannula oxygen Cardiovascular status: blood pressure returned to baseline and stable Postop Assessment: no apparent nausea or vomiting Anesthetic complications: no   No notable events documented.  Last Vitals:  Vitals:   08/20/21 0945 08/20/21 1046  BP: (!) 99/58 (!) 95/54  Pulse: 69   Resp: 15 15  Temp: 36.8 C 37.3 C  SpO2: 95% 96%    Last Pain:  Vitals:   08/20/21 1046  TempSrc: Oral  PainSc:                  March Rummage Jaxiel Kines

## 2021-08-20 NOTE — Transfer of Care (Signed)
Immediate Anesthesia Transfer of Care Note  Patient: Karen [name]  Procedure(s) Performed: APPENDECTOMY LAPAROSCOPIC  Patient Location: PACU  Anesthesia Type:General  Level of Consciousness: oriented, drowsy, patient cooperative and responds to stimulation  Airway & Oxygen Therapy: Patient Spontanous Breathing and Patient connected to nasal cannula oxygen  Post-op Assessment: Report given to RN, Post -op Vital signs reviewed and stable and Patient moving all extremities X 4  Post vital signs: Reviewed and stable  Last Vitals:  Vitals Value Taken Time  BP 102/59 08/20/21 0925  Temp    Pulse 66 08/20/21 0927  Resp 17 08/20/21 0927  SpO2 100 % 08/20/21 0927  Vitals shown include unvalidated device data.  Last Pain:  Vitals:   08/20/21 0604  TempSrc: Oral  PainSc: 3       Patients Stated Pain Goal: 4 (09/81/19 1478)  Complications: No notable events documented.

## 2021-08-20 NOTE — Anesthesia Procedure Notes (Signed)
Procedure Name: Intubation Date/Time: 08/20/2021 8:11 AM Performed by: Claris Che, CRNA Pre-anesthesia Checklist: Patient identified, Emergency Drugs available, Suction available, Patient being monitored and Timeout performed Patient Re-evaluated:Patient Re-evaluated prior to induction Oxygen Delivery Method: Circle system utilized Preoxygenation: Pre-oxygenation with 100% oxygen Induction Type: IV induction and Cricoid Pressure applied Ventilation: Mask ventilation without difficulty Laryngoscope Size: Mac and 4 Grade View: Grade I Tube type: Oral Tube size: 7.5 mm Number of attempts: 1 Airway Equipment and Method: Stylet Placement Confirmation: ETT inserted through vocal cords under direct vision, positive ETCO2 and breath sounds checked- equal and bilateral Secured at: 22 cm Tube secured with: Tape Dental Injury: Teeth and Oropharynx as per pre-operative assessment

## 2021-08-20 NOTE — Progress Notes (Signed)
AVS provided to pt. All questions answered at this time. Pt. Discharged home with husband.

## 2021-08-20 NOTE — Progress Notes (Signed)
2 sites were documented but upon arrival 3 were noted

## 2021-08-20 NOTE — Progress Notes (Signed)
General Surgery Follow Up Note  Subjective:    Overnight Issues:   Objective:  Vital signs for last 24 hours: Temp:  [98 F (36.7 C)-99 F (37.2 C)] 99 F (37.2 C) (02/19 0604) Pulse Rate:  [44-77] 70 (02/19 0604) Resp:  [8-24] 18 (02/19 0500) BP: (100-148)/(59-86) 101/63 (02/19 0604) SpO2:  [94 %-100 %] 98 % (02/19 0604) Weight:  [59 kg] 59 kg (02/19 0703)  Hemodynamic parameters for last 24 hours:    Intake/Output from previous day: 02/18 0701 - 02/19 0700 In: 501.6 [IV Piggyback:501.6] Out: -   Intake/Output this shift: No intake/output data recorded.  Vent settings for last 24 hours:    Physical Exam:  Gen: comfortable, no distress Neuro: non-focal exam HEENT: PERRL Neck: supple CV: RRR Pulm: unlabored breathing Abd: soft, RLQ TTP GU: clear yellow urine Extr: wwp, no edema   Results for orders placed or performed during the hospital encounter of 08/19/21 (from the past 24 hour(s))  Lipase, blood     Status: None   Collection Time: 08/19/21  1:00 PM  Result Value Ref Range   Lipase 16 11 - 51 U/L  Comprehensive metabolic panel     Status: Abnormal   Collection Time: 08/19/21  1:00 PM  Result Value Ref Range   Sodium 134 (L) 135 - 145 mmol/L   Potassium 4.6 3.5 - 5.1 mmol/L   Chloride 102 98 - 111 mmol/L   CO2 22 22 - 32 mmol/L   Glucose, Bld 121 (H) 70 - 99 mg/dL   BUN 19 6 - 20 mg/dL   Creatinine, Ser 0.77 0.44 - 1.00 mg/dL   Calcium 9.9 8.9 - 10.3 mg/dL   Total Protein 7.4 6.5 - 8.1 g/dL   Albumin 4.7 3.5 - 5.0 g/dL   AST 17 15 - 41 U/L   ALT 22 0 - 44 U/L   Alkaline Phosphatase 32 (L) 38 - 126 U/L   Total Bilirubin 0.7 0.3 - 1.2 mg/dL   GFR, Estimated >60 >60 mL/min   Anion gap 10 5 - 15  CBC     Status: Abnormal   Collection Time: 08/19/21  1:00 PM  Result Value Ref Range   WBC 13.9 (H) 4.0 - 10.5 K/uL   RBC 4.78 3.87 - 5.11 MIL/uL   Hemoglobin 14.2 12.0 - 15.0 g/dL   HCT 42.4 36.0 - 46.0 %   MCV 88.7 80.0 - 100.0 fL   MCH 29.7  26.0 - 34.0 pg   MCHC 33.5 30.0 - 36.0 g/dL   RDW 14.7 11.5 - 15.5 %   Platelets 154 150 - 400 K/uL   nRBC 0.0 0.0 - 0.2 %  Urinalysis, Routine w reflex microscopic Urine, Clean Catch     Status: Abnormal   Collection Time: 08/19/21  1:57 PM  Result Value Ref Range   Color, Urine YELLOW YELLOW   APPearance CLEAR CLEAR   Specific Gravity, Urine 1.025 1.005 - 1.030   pH 6.0 5.0 - 8.0   Glucose, UA NEGATIVE NEGATIVE mg/dL   Hgb urine dipstick NEGATIVE NEGATIVE   Bilirubin Urine NEGATIVE NEGATIVE   Ketones, ur >80 (A) NEGATIVE mg/dL   Protein, ur TRACE (A) NEGATIVE mg/dL   Nitrite NEGATIVE NEGATIVE   Leukocytes,Ua NEGATIVE NEGATIVE  Pregnancy, urine     Status: None   Collection Time: 08/19/21  1:57 PM  Result Value Ref Range   Preg Test, Ur NEGATIVE NEGATIVE  Resp Panel by RT-PCR (Flu A&B, Covid) Nasopharyngeal Swab  Status: None   Collection Time: 08/19/21 11:46 PM   Specimen: Nasopharyngeal Swab; Nasopharyngeal(NP) swabs in vial transport medium  Result Value Ref Range   SARS Coronavirus 2 by RT PCR NEGATIVE NEGATIVE   Influenza A by PCR NEGATIVE NEGATIVE   Influenza B by PCR NEGATIVE NEGATIVE    Assessment & Plan:  Present on Admission:  Acute appendicitis    LOS: 0 days   Additional comments:I reviewed the patient's new clinical lab test results.   and I reviewed the patients new imaging test results.    Acute appendicitis - plan for lap appy today. Informed consent was obtained after detailed explanation of risks, including bleeding, infection, abscess, staple line leak, stump appendicitis, injury to surrounding structures, and need for conversion to open procedure. All questions answered to the patient's satisfaction. FEN - NPO DVT - SCDs, LMWH Dispo - home postop    Karen Oka, MD Trauma & General Surgery Please use [email or web address] to contact on call provider  08/20/2021  *Care during the described time interval was provided by me. I have reviewed this  patient's available data, including medical history, events of note, physical examination and test results as part of my evaluation.

## 2021-08-20 NOTE — Discharge Summary (Signed)
Patient ID: Karen [name] [id number] 01-31-1971 51 y.o.  Admit date: 08/19/2021 Discharge date: 08/20/2021  Admitting Diagnosis: appendicitis  Discharge Diagnosis Patient Active Problem List   Diagnosis Date Noted   Acute appendicitis 08/19/2021   Cephalalgia 12/24/2014   Vertigo 11/08/2014   Slurring of speech 11/08/2014   Subacute confusional state 11/08/2014   Rectus diastasis, 3 cm, mild 01/06/2014   Lipoma of abdominal wall, 4 x 8 x 2 cm 01/06/2014   Diastasis of muscle 01/06/2014   Lipoma of skin and subcutaneous tissue (excluding face) 01/06/2014   Varicose veins of lower extremities with other complications 09/32/6712   Pain in limb 12/04/2011    Consultants none  Reason for Admission: appendicitis  Procedures [facility] Course:  uncomplicated    Physical Exam: Gen: comfortable, no distress Neuro: non-focal exam HEENT: PERRL Neck: supple CV: RRR Pulm: unlabored breathing Abd: soft, incisions c/d/i GU: clear yellow urine Extr: wwp, no edema   Allergies as of 08/20/2021       Reactions   Penicillins         Medication List     TAKE these medications    acetaminophen 500 MG tablet Commonly known as: TYLENOL Take 1,000 mg by mouth every 6 (six) hours as needed for moderate pain or headache. What changed: Another medication with the same name was added. Make sure you understand how and when to take each.   acetaminophen 500 MG tablet Commonly known as: TYLENOL Take 2 tablets (1,000 mg total) by mouth every 6 (six) hours as needed. What changed: You were already taking a medication with the same name, and this prescription was added. Make sure you understand how and when to take each.   docusate sodium 100 MG capsule Commonly known as: Colace Take 1 capsule (100 mg total) by mouth 2 (two) times daily.   ibuprofen 200 MG tablet Commonly known as: ADVIL Take 600 mg by mouth every 6 (six) hours as needed for headache or moderate  pain. What changed: Another medication with the same name was added. Make sure you understand how and when to take each.   ibuprofen 600 MG tablet Commonly known as: ADVIL Take 1 tablet (600 mg total) by mouth 4 (four) times daily. What changed: You were already taking a medication with the same name, and this prescription was added. Make sure you understand how and when to take each.   magnesium gluconate 500 MG tablet Commonly known as: MAGONATE Take 500-1,000 mg by mouth daily.   meloxicam 15 MG tablet Commonly known as: MOBIC Take 15 mg by mouth daily.   methocarbamol 750 MG tablet Commonly known as: Robaxin-750 Take 1 tablet (750 mg total) by mouth 4 (four) times daily.   nortriptyline 10 MG capsule Commonly known as: PAMELOR Take 1 capsule (10 mg total) by mouth at bedtime.   ONE-A-DAY WOMENS PO Take 1 tablet by mouth daily.   oxyCODONE 5 MG immediate release tablet Commonly known as: Roxicodone Take 1 tablet (5 mg total) by mouth every 4 (four) hours as needed.   SELENIUM PO Take 1 tablet by mouth daily.   VITAMIN B-12 CR PO Take 1 tablet by mouth daily.   Vitamin D-3 125 MCG (5000 UT) Tabs Take 5,000 Units by mouth daily.          Follow-up Information     Jesusita Oka, MD Follow up in 2 week(s).   Specialty: Surgery Contact information: [place]  [place] [address] [phone]                  Signed: Jesusita Oka, [facility] 08/20/2021, 9:25 AM

## 2021-08-20 NOTE — ED Notes (Signed)
Pt called regarding results. Referred her to [facility] and/or medical records

## 2021-08-20 NOTE — Op Note (Signed)
° °  Operative Note   Date: 08/20/2021  Procedure: laparoscopic appendectomy  Pre-op diagnosis: acute appendicitis Post-op diagnosis: Grade 1c appendicitis  Indication and clinical history: The patient is a 52 y.o. year old female with acute appendicitis     Surgeon: Jesusita Oka, MD  Anesthesiologist: Gloris Manchester, MD Anesthesia: General  Findings:  Specimen: appendix EBL: <5cc Drains/Implants: none  Disposition: PACU - hemodynamically stable.  Description of procedure: The patient was positioned supine on the operating room table. Time-out was performed verifying correct patient, procedure, signature of informed consent, and administration of pre-operative antibiotics. General anesthetic induction and intubation were uneventful. Foley catheter insertion was not performed as patient voided immediately prior to the procedure . The abdomen was prepped and draped in the usual sterile fashion. An infra-umbilical incision was made using an open technique using zero vicryl stay sutures on either side of the fascia and a 25mm Hassan port inserted. After establishing pneumoperitoneum, which the patient tolerated well, the abdominal cavity was inspected and no injury of any intra-abdominal structures was identified. Two additional five millimeter ports were placed under direct visualization and using local anesthetic in the suprapubic and left lower quadrant regions. The patient was repositioned to Trendelenburg with the left side down. Further inspection of the right lower quadrant revealed grade 1c appendicitis. Adhesiolysis was performed for approximately 10 minutes during dissection, mobilization, and identification of the appendix. The appendix was dissected away from its mesoappendix and an endoscopic stapler used to divide the mesoappendix using a vascular load. A bowel load of the endoscopic stapler was used to staple across the appendix at its base. Both staple lines were inspected and found  to be intact and without bleeding. The appendix was placed in an endoscopic specimen retrieval bag, removed via the umbilical port site, and sent to pathology as a permanent specimen. The right lower quadrant was again inspected and hemostasis confirmed. The suprapubic and left lower quadrant ports were removed under direct visualization and hemostasis confirmed. The umbilical port was removed last after desufflating the abdomen and the fascia re-approximated using the stay sutures. Additional local anesthetic was administered at the umbilical incision site. The skin of all port sites was closed with 4-0 monocryl. Sterile dressings were applied. All sponge and instrument counts were correct at the conclusion of the procedure. The patient was awakened from anesthesia, extubated uneventfully, and transported to the PACU in good condition. There were no complications.   Upon entering the abdomen (organ space), I encountered infection of the appendix .  CASE DATA:  Type of patient?: DOW CASE (Surgical Hospitalist [facility])  Status of Case? URGENT Add On  Infection Present At Time Of Surgery (PATOS)?  INFECTION of the appendix   Jesusita Oka, MD General and [facility]

## 2021-08-20 NOTE — ED Notes (Signed)
5N called for purple man for Rm 2.

## 2021-08-21 ENCOUNTER — Encounter (HOSPITAL_COMMUNITY): Payer: Self-pay | Admitting: Surgery

## 2021-08-22 LAB — SURGICAL PATHOLOGY

## 2021-09-05 ENCOUNTER — Encounter (HOSPITAL_COMMUNITY): Payer: Self-pay

## 2021-09-05 ENCOUNTER — Emergency Department (HOSPITAL_COMMUNITY)
Admission: EM | Admit: 2021-09-05 | Discharge: 2021-09-05 | Disposition: A | Discharge status: Home / Self Care | Payer: No Typology Code available for payment source | Attending: Emergency Medicine | Admitting: Emergency Medicine

## 2021-09-05 ENCOUNTER — Emergency Department (HOSPITAL_COMMUNITY): Payer: No Typology Code available for payment source

## 2021-09-05 ENCOUNTER — Other Ambulatory Visit: Payer: Self-pay

## 2021-09-05 DIAGNOSIS — K5900 Constipation, unspecified: Secondary | ICD-10-CM

## 2021-09-05 DIAGNOSIS — R7989 Other specified abnormal findings of blood chemistry: Secondary | ICD-10-CM

## 2021-09-05 DIAGNOSIS — N9489 Other specified conditions associated with female genital organs and menstrual cycle: Secondary | ICD-10-CM | POA: Insufficient documentation

## 2021-09-05 DIAGNOSIS — R110 Nausea: Secondary | ICD-10-CM | POA: Diagnosis not present

## 2021-09-05 DIAGNOSIS — R1013 Epigastric pain: Secondary | ICD-10-CM | POA: Insufficient documentation

## 2021-09-05 DIAGNOSIS — R109 Unspecified abdominal pain: Secondary | ICD-10-CM

## 2021-09-05 LAB — CBC WITH DIFFERENTIAL/PLATELET
Abs Immature Granulocytes: 0.03 10*3/uL (ref 0.00–0.07)
Basophils Absolute: 0 10*3/uL (ref 0.0–0.1)
Basophils Relative: 0 %
Eosinophils Absolute: 0 10*3/uL (ref 0.0–0.5)
Eosinophils Relative: 0 %
HCT: 41.6 % (ref 36.0–46.0)
Hemoglobin: 13.7 g/dL (ref 12.0–15.0)
Immature Granulocytes: 0 %
Lymphocytes Relative: 24 %
Lymphs Abs: 1.7 10*3/uL (ref 0.7–4.0)
MCH: 30 pg (ref 26.0–34.0)
MCHC: 32.9 g/dL (ref 30.0–36.0)
MCV: 91.2 fL (ref 80.0–100.0)
Monocytes Absolute: 0.3 10*3/uL (ref 0.1–1.0)
Monocytes Relative: 4 %
Neutro Abs: 5 10*3/uL (ref 1.7–7.7)
Neutrophils Relative %: 72 %
Platelets: 208 10*3/uL (ref 150–400)
RBC: 4.56 MIL/uL (ref 3.87–5.11)
RDW: 14.3 % (ref 11.5–15.5)
WBC: 7.1 10*3/uL (ref 4.0–10.5)
nRBC: 0 % (ref 0.0–0.2)

## 2021-09-05 LAB — URINALYSIS, ROUTINE W REFLEX MICROSCOPIC
Bilirubin Urine: NEGATIVE
Glucose, UA: NEGATIVE mg/dL
Hgb urine dipstick: NEGATIVE
Ketones, ur: 5 mg/dL — AB
Leukocytes,Ua: NEGATIVE
Nitrite: NEGATIVE
Protein, ur: NEGATIVE mg/dL
Specific Gravity, Urine: 1.006 (ref 1.005–1.030)
pH: 8 (ref 5.0–8.0)

## 2021-09-05 LAB — COMPREHENSIVE METABOLIC PANEL
ALT: 116 U/L — ABNORMAL HIGH (ref 0–44)
AST: 199 U/L — ABNORMAL HIGH (ref 15–41)
Albumin: 4.3 g/dL (ref 3.5–5.0)
Alkaline Phosphatase: 71 U/L (ref 38–126)
Anion gap: 5 (ref 5–15)
BUN: 18 mg/dL (ref 6–20)
CO2: 26 mmol/L (ref 22–32)
Calcium: 9.4 mg/dL (ref 8.9–10.3)
Chloride: 102 mmol/L (ref 98–111)
Creatinine, Ser: 0.67 mg/dL (ref 0.44–1.00)
GFR, Estimated: >60 mL/min (ref >60)
Glucose, Bld: 94 mg/dL (ref 70–99)
Potassium: 3.5 mmol/L (ref 3.5–5.1)
Sodium: 133 mmol/L — ABNORMAL LOW (ref 135–145)
Total Bilirubin: 0.7 mg/dL (ref 0.3–1.2)
Total Protein: 7.1 g/dL (ref 6.5–8.1)

## 2021-09-05 LAB — TROPONIN I (HIGH SENSITIVITY): Troponin I (High Sensitivity): 3 ng/L (ref <18)

## 2021-09-05 LAB — LIPASE, BLOOD: Lipase: 40 U/L (ref 11–51)

## 2021-09-05 LAB — I-STAT BETA HCG BLOOD, ED (MC, WL, AP ONLY): I-stat hCG, quantitative: <5 m[IU]/mL (ref <5)

## 2021-09-05 MED ORDER — DICYCLOMINE HCL 20 MG PO TABS: 20.0000 mg | ORAL_TABLET | Freq: Two times a day (BID) | ORAL | 0 refills | Status: DC | PRN

## 2021-09-05 MED ORDER — IOHEXOL 300 MG/ML  SOLN
80.0000 mL | Freq: Once | INTRAMUSCULAR | Status: AC | PRN
  Administered 2021-09-05: 80 mL via INTRAVENOUS

## 2021-09-05 MED ORDER — IOHEXOL 9 MG/ML PO SOLN: 1000.0000 mL | ORAL | Status: AC

## 2021-09-05 MED ORDER — IOHEXOL 9 MG/ML PO SOLN
ORAL | Status: AC
  Administered 2021-09-05: 1000 mL via ORAL
  Filled 2021-09-05: qty 1000

## 2021-09-05 MED ORDER — POLYETHYLENE GLYCOL 3350 17 G PO PACK: 17.0000 g | PACK | Freq: Every day | ORAL | 0 refills | Status: AC

## 2021-09-05 NOTE — Discharge Instructions (Addendum)
CT scan did not show any acute complications associated with your surgery.  There is no abscess or obstruction.  Scan did show moderate constipation.  Start taking the MiraLAX again.  You can try taking Bentyl as needed for abdominal cramping and pain.  Consider following up with a GI doctor as planned. ? ?Your liver function tests were mildly elevated today.  Follow-up with your doctor in a week or 2 to have them rechecked. ?

## 2021-09-05 NOTE — ED Provider Notes (Signed)
?[facility] DEPT ?Provider Note ? ? ?CSN: [id number] ?Arrival date & time: 09/05/21  1359 ? ?  ? ?History ? ?Chief Complaint  ?Patient presents with  ? Abdominal Pain  ? ? ?Karen [name] is a 51 y.o. female history of cholecystectomy, 2 C-sections, laparoscopic appendectomy 08/20/21 with Dr Bobbye Morton, presenting to the ED with abdominal pain.  Patient reports she felt she been recovering and doing well after her surgery until earlier today.  She did walk and run lightly on a treadmill for the first time.  She had no pain at that time.  When she was out shopping she began having severe stabbing pains in her epigastrium that of been persistent since then.  She says she has had similar pains in the past, even prior to her surgery, but she attributed them to muscle problems or anxiety.  She reports she has had some nausea with her pain.  She had a regular bowel movement last night does not feel constipated.  No history of bowel obstructions.  She is here with her husband at the bedside. ? ?On her prior visit to the ED, she will ended up requiring 2 CT scans, including oral contrast, because she has an extremely thin habitus and had limited bowel views. ? ?HPI ? ?  ? ?Home Medications ?Prior to Admission medications   ?Medication Sig Start Date End Date Taking? Authorizing Provider  ?acetaminophen (TYLENOL) 500 MG tablet Take 1,000 mg by mouth every 6 (six) hours as needed for moderate pain or headache.    [provider]  ?acetaminophen (TYLENOL) 500 MG tablet Take 2 tablets (1,000 mg total) by mouth every 6 (six) hours as needed. 08/20/21 08/20/22  Jesusita Oka, MD  ?Cholecalciferol (VITAMIN D-3) 125 MCG (5000 UT) TABS Take 5,000 Units by mouth daily.    [provider]  ?Cyanocobalamin (VITAMIN B-12 CR PO) Take 1 tablet by mouth daily.    [provider]  ?docusate sodium (COLACE) 100 MG capsule Take 1 capsule (100 mg total) by mouth 2 (two) times daily. 08/20/21  08/20/22  Jesusita Oka, MD  ?ibuprofen (ADVIL) 200 MG tablet Take 600 mg by mouth every 6 (six) hours as needed for headache or moderate pain.    [provider]  ?ibuprofen (ADVIL) 600 MG tablet Take 1 tablet (600 mg total) by mouth 4 (four) times daily. 08/20/21   Jesusita Oka, MD  ?magnesium gluconate (MAGONATE) 500 MG tablet Take 500-1,000 mg by mouth daily.    [provider]  ?meloxicam (MOBIC) 15 MG tablet Take 15 mg by mouth daily. ?Patient not taking: Reported on 08/19/2021 07/14/21   [provider]  ?methocarbamol (ROBAXIN-750) 750 MG tablet Take 1 tablet (750 mg total) by mouth 4 (four) times daily. 08/20/21   Jesusita Oka, MD  ?Multiple Vitamins-Minerals (ONE-A-DAY WOMENS PO) Take 1 tablet by mouth daily.    [provider]  ?nortriptyline (PAMELOR) 10 MG capsule Take 1 capsule (10 mg total) by mouth at bedtime. ?Patient not taking: Reported on 08/19/2021 04/13/15   Melvenia Beam, MD  ?oxyCODONE (ROXICODONE) 5 MG immediate release tablet Take 1 tablet (5 mg total) by mouth every 4 (four) hours as needed. 08/20/21   Jesusita Oka, MD  ?SELENIUM PO Take 1 tablet by mouth daily.    [provider]  ?   ? ?Allergies    ?Penicillins   ? ?Review of Systems   ?Review of Systems ? ?Physical Exam ?Updated  Vital Signs ?BP 120/88 (BP Location: Left Arm)   Pulse (!) 104   Temp 97.9 ?F (36.6 ?C) (Oral)   Resp 18   Ht 5' 7.5" (1.715 m)   Wt 59 kg   LMP 08/19/2021 (Approximate)   SpO2 100%   BMI 20.06 kg/m?  ?Physical Exam ?Constitutional:   ?   General: She is not in acute distress. ?HENT:  ?   Head: Normocephalic and atraumatic.  ?Eyes:  ?   Conjunctiva/sclera: Conjunctivae normal.  ?   Pupils: Pupils are equal, round, and reactive to light.  ?Cardiovascular:  ?   Rate and Rhythm: Normal rate and regular rhythm.  ?Pulmonary:  ?   Effort: Pulmonary effort is normal. No respiratory distress.  ?Abdominal:  ?   General: There is no distension.  ?    Tenderness: There is no abdominal tenderness. There is no guarding. Negative signs include McBurney's sign.  ?Skin: ?   General: Skin is warm and dry.  ?Neurological:  ?   General: No focal deficit present.  ?   Mental Status: She is alert. Mental status is at baseline.  ?Psychiatric:     ?   Mood and Affect: Mood normal.     ?   Behavior: Behavior normal.  ? ? ?ED Results / Procedures / Treatments   ?Labs ?(all labs ordered are listed, but only abnormal results are displayed) ?Labs Reviewed  ?COMPREHENSIVE METABOLIC PANEL - Abnormal; Notable for the following components:  ?    Result Value  ? Sodium 133 (*)   ? AST 199 (*)   ? ALT 116 (*)   ? All other components within normal limits  ?URINALYSIS, ROUTINE W REFLEX MICROSCOPIC - Abnormal; Notable for the following components:  ? Color, Urine STRAW (*)   ? Ketones, ur 5 (*)   ? All other components within normal limits  ?LIPASE, BLOOD  ?CBC WITH DIFFERENTIAL/PLATELET  ?I-STAT BETA HCG BLOOD, ED ([facility], [facility], [facility] ONLY)  ?TROPONIN I (HIGH SENSITIVITY)  ? ? ?EKG ?EKG Interpretation ? ?Date/Time:  Tuesday September 05 2021 15:34:09 EST ?Ventricular Rate:  49 ?PR Interval:  128 ?QRS Duration: 104 ?QT Interval:  460 ?QTC Calculation: 416 ?R Axis:   40 ?Text Interpretation: Sinus bradycardia No STEMI No sig changes from prior? tracing - bradycardia noted on Feb 2023 EG Confirmed by Octaviano Glow [phone]) on 09/05/2021 3:51:06 PM ? ?Radiology ?No results found. ? ?Procedures ?Procedures  ? ? ?Medications Ordered in ED ?Medications  ?iohexol (OMNIPAQUE) 9 MG/ML oral solution 1,000 mL (1,000 mLs Oral Contrast Given 09/05/21 1538)  ? ? ?ED Course/ Medical Decision Making/ A&P ?Clinical Course as of 09/05/21 1723  ?Tue Sep 05, 2021  ?1551 EKG per my interpretation shows a sinus bradycardia which is similar to her prior EKGs have been in the past.  I doubt she is symptomatic from this bradycardia, which may be physiological for her age and habitus [MT]  ?1723 Pt signed out to Dr Tomi Bamberger EDP  pending CT scan - if unremarkable anticipate discharge home [MT]  ?  ?Clinical Course User Index ?[MT] Wyvonnia Dusky, MD  ? ?                        ?Medical Decision Making ?Amount and/or Complexity of Data Reviewed ?Labs: ordered. ?Radiology: ordered. ?ECG/medicine tests: ordered. ? ?Risk ?Prescription drug management. ? ? ?This patient presents to the Emergency Department with complaint of abdominal pain. This involves an extensive number  of treatment options, and is a complaint that carries with it a high risk of complications and morbidity.  The differential diagnosis includes, but is not limited to, Gi perforation vs abscess vs chronic abdominal pain vs pancreatitis vs UTI vs other ? ?She overall is a soft abdominal exam, no peritonitis or guarding to suggest GI perforation ? ?I ordered, reviewed, and interpreted labs, including BMP and CBC.  There were no immediate, life-threatening emergencies found in this labwork.  Patient's UA showed no signs of infection ?I ordered imaging studies which included CT abdomen pelvis with IV and oral contrast ?Additional history was obtained from patient's husband at bedside ?External records reviewed including hospital course and discharge summary from Feb 0349, uncomplicated appendectomy at that time ? ?Low suspicion for ACS, PE. ? ?Clinical Course as of 09/05/21 1724  ?Tue Sep 05, 2021  ?1551 EKG per my interpretation shows a sinus bradycardia which is similar to her prior EKGs have been in the past.  I doubt she is symptomatic from this bradycardia, which may be physiological for her age and habitus [MT]  ?1723 Pt signed out to Dr Tomi Bamberger EDP pending CT scan - if unremarkable anticipate discharge home [MT]  ?  ?Clinical Course User Index ?[MT] Wyvonnia Dusky, MD  ? ? ? ? ? ? ? ? ? ?Final Clinical Impression(s) / ED Diagnoses ?Final diagnoses:  ?None  ? ? ?Rx / DC Orders ?ED Discharge Orders   ? ? None  ? ?  ? ? ?  ?Wyvonnia Dusky, MD ?09/05/21 1724 ? ?

## 2021-09-05 NOTE — ED Triage Notes (Addendum)
Patient c/o epigastric pain x 45 minutes ago. Patient denies any n/v/D. Patient states tightness worsens with a deep breath ?

## 2021-09-05 NOTE — ED Provider Notes (Signed)
Patient was initially seen by Dr. Oneta Rack.  Please see his note.  Plan was to follow-up on the CT scan.  CT does not show any acute findings.  Moderate constipation noted.  Discussed the findings with the patient as well as her husband.  Patient did have elevated LFTs today but there are no abnormalities on the scan to account for that.  She denies any regular alcohol use.  Discussed outpatient follow-up with PCP to have her liver test rechecked.  We will also prescribe Bentyl and MiraLAX for possible abdominal spasms and the constipation.  Discussed outpatient follow-up with PCP and patient also has plans to follow-up with a GI doctor. ?  ?Karen Rank, MD ?09/05/21 1849 ? ?

## 2022-03-16 IMAGING — CT CT ABD-PELV W/ CM
2 of 5 series · 16 of 46 positions shown, 18 images · IV contrast (agent unspecified)
Comparison: CT studies of 08/19/2021

CLINICAL DATA: Appendectomy 2 weeks ago. Acute abdominal pain.
Question perforation or abscess.

EXAM:
CT ABDOMEN AND PELVIS WITH CONTRAST
TECHNIQUE: Multidetector CT imaging of the abdomen and pelvis was performed
using the standard protocol following bolus administration of
intravenous contrast.

[Series 2: axial st · axial · 0.74mm/px · z∈[-605,-210]mm · 13 of 93 slices shown, 15 images]
[im 7/93  soft-tissue]
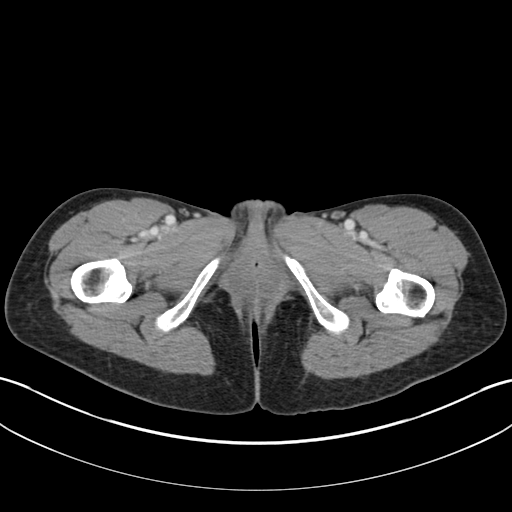
[im 7/93  bone]
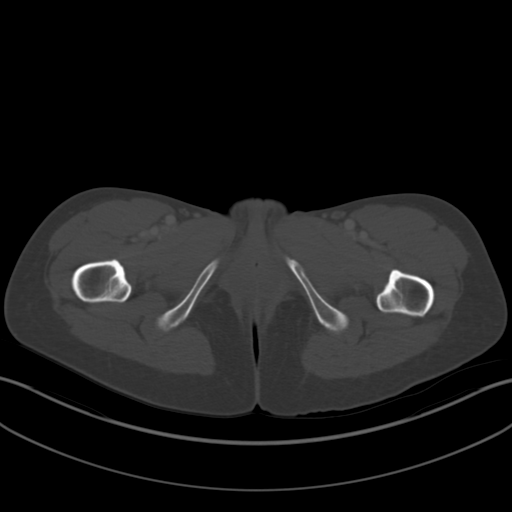
[im 13/93  soft-tissue]
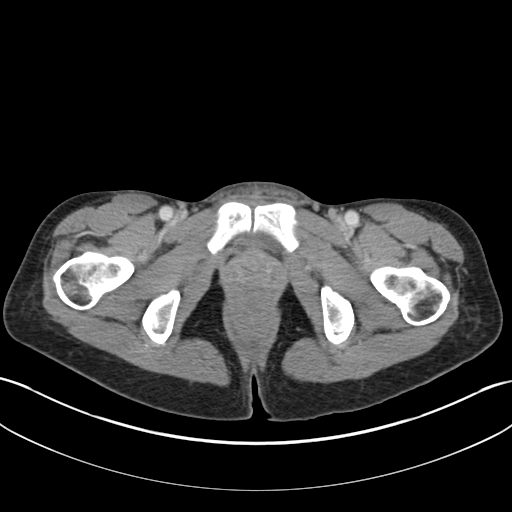
[im 19/93  soft-tissue]
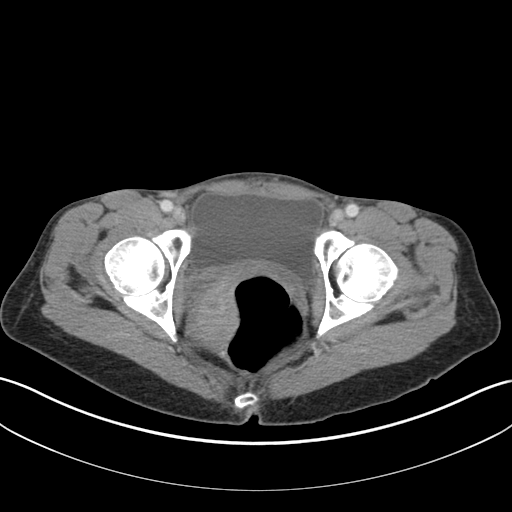
[im 25/93  soft-tissue]
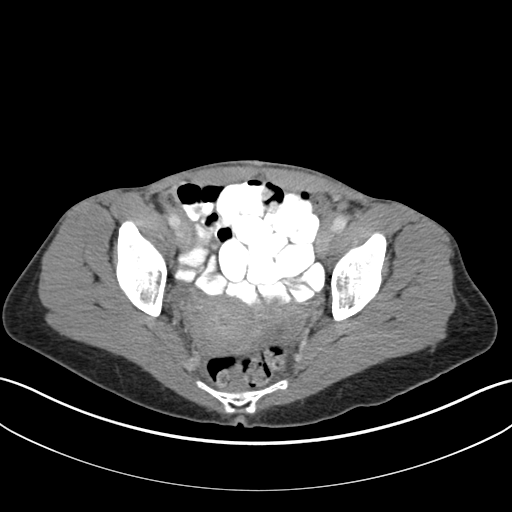
[im 31/93  soft-tissue]
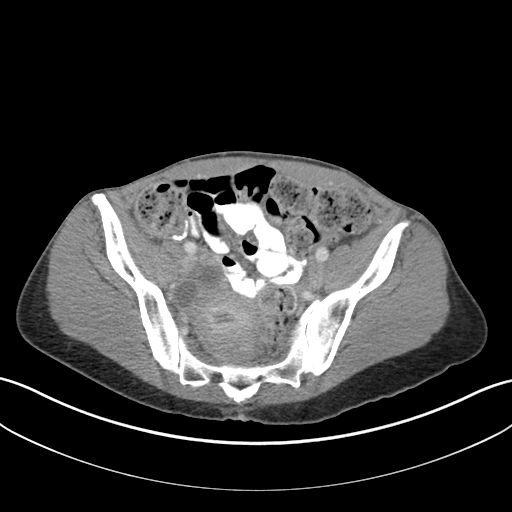
[im 37/93  soft-tissue]
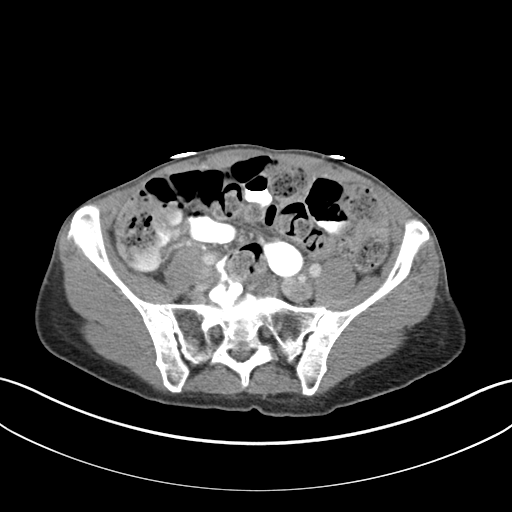
[im 50/93  soft-tissue]
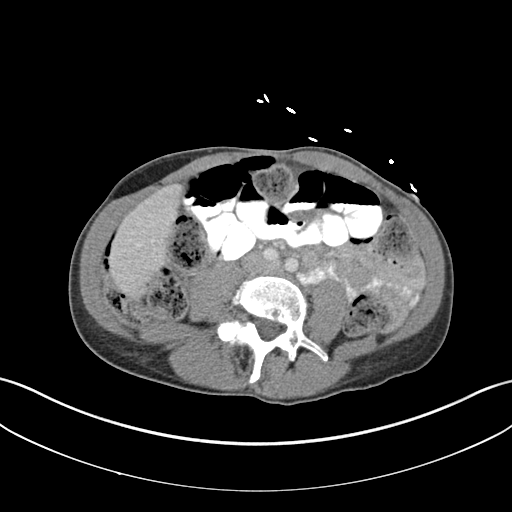
[im 56/93  soft-tissue]
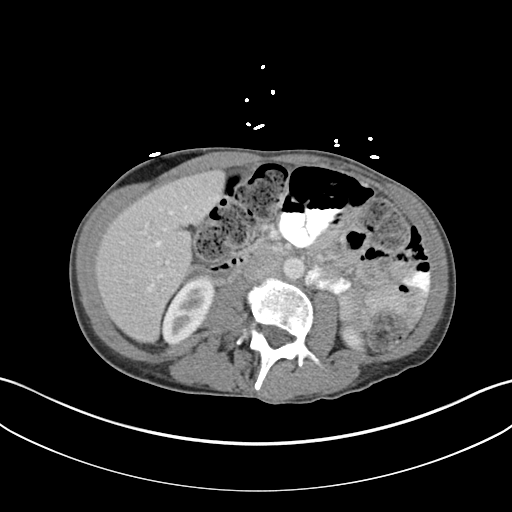
[im 62/93  soft-tissue]
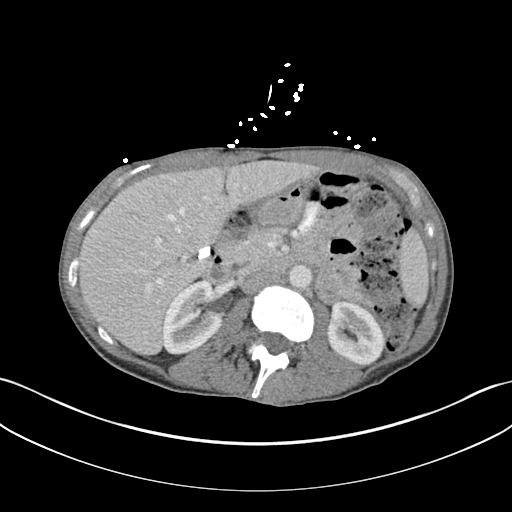
[im 62/93  bone]
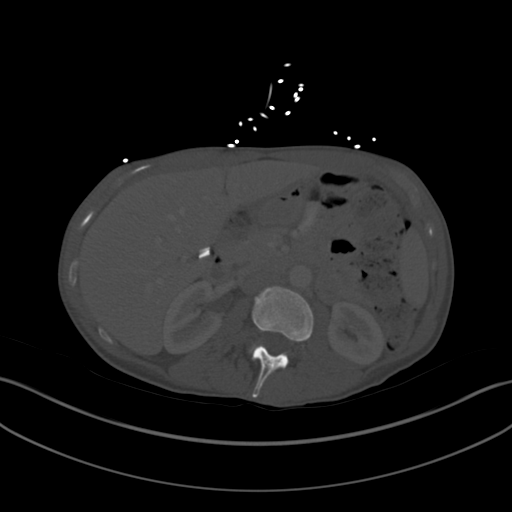
[im 68/93  soft-tissue]
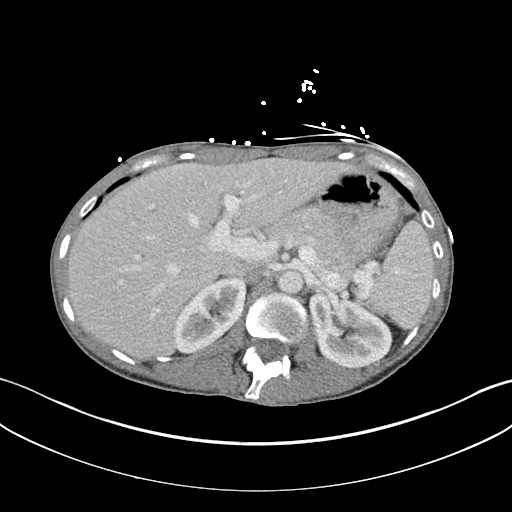
[im 74/93  soft-tissue]
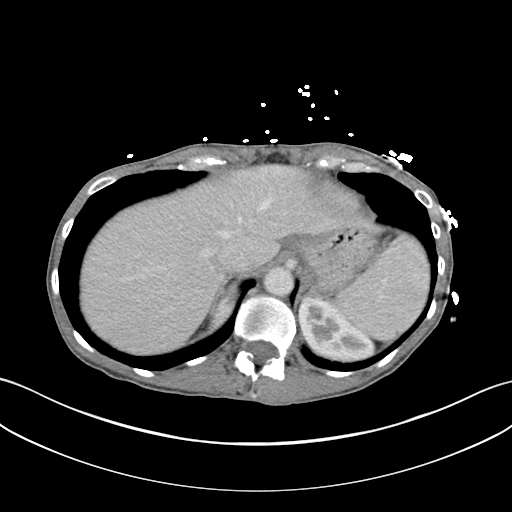
[im 80/93  soft-tissue]
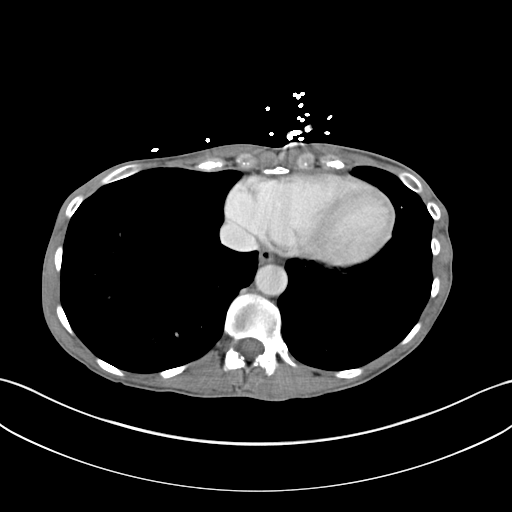
[im 86/93  soft-tissue]
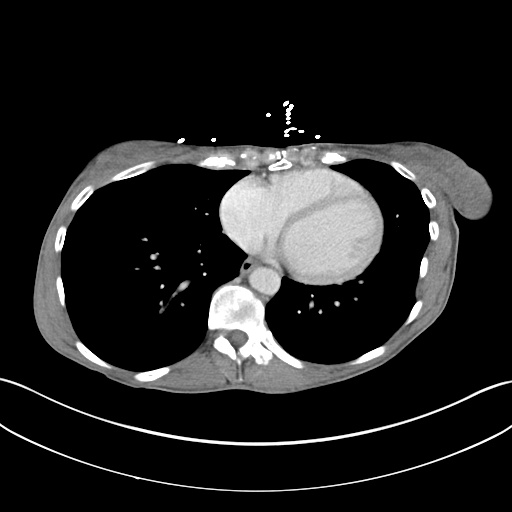

[Series 4: coronal st · coronal · 0.76mm/px · 3 of 118 slices shown]
[im 40/118  soft-tissue]
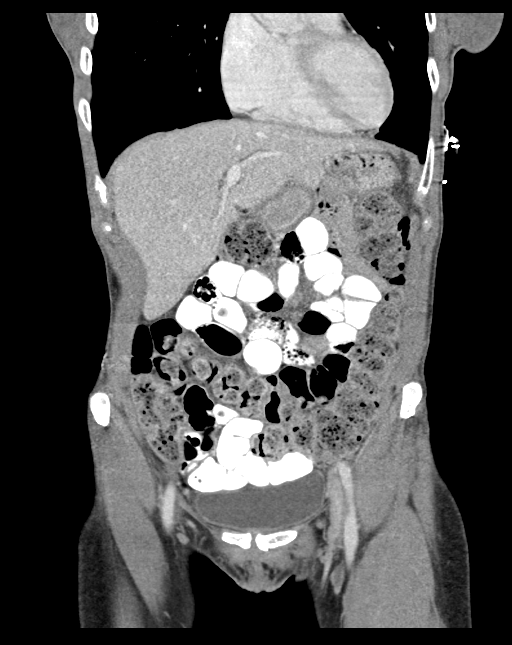
[im 53/118  soft-tissue]
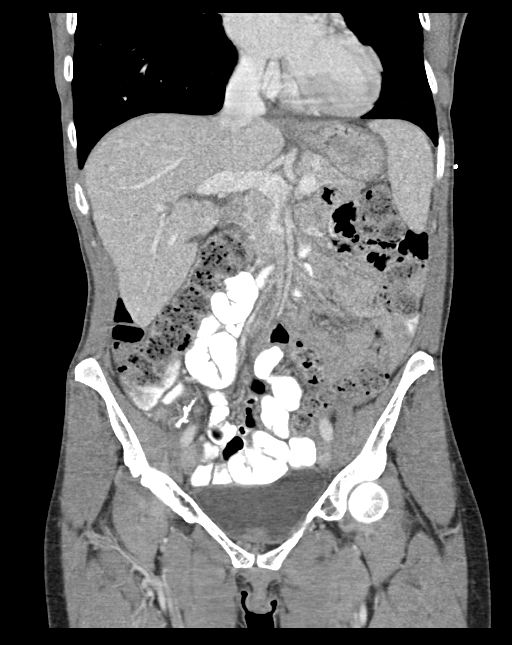
[im 66/118  soft-tissue]
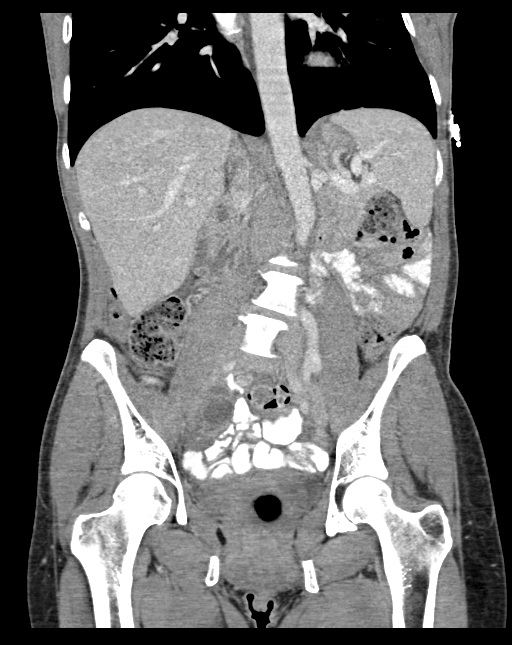

[16 of 46 positions shown; findings below may reference images not displayed]

RADIATION DOSE REDUCTION: This exam was performed according to the
departmental dose-optimization program which includes automated
exposure control, adjustment of the mA and/or kV according to
patient size and/or use of iterative reconstruction technique.

CONTRAST:  80mL OMNIPAQUE IOHEXOL 300 MG/ML  SOLN
FINDINGS: Lower chest: Lung bases are clear.

Hepatobiliary: Previous cholecystectomy. Liver parenchyma is normal.

Pancreas: Normal

Spleen: Normal

Adrenals/Urinary Tract: Adrenal glands are normal. Kidneys are
normal. Bladder is normal.

Stomach/Bowel: Stomach and small intestine are normal. There is aim
moderate amount of fecal matter throughout the colon. There has been
recent appendectomy. Surgical clips present as expected. No sign of
regional abscess or contrast leakage.

Vascular/Lymphatic: Aorta and IVC are normal.  No adenopathy.

Reproductive: Uterus and adnexal regions are normal. Two follicular
cyst associated with the right ovary, the larger measuring 2.1 cm in
diameter.

Other: No free fluid or air.

Musculoskeletal: Chronic scoliotic curvature of the spine as seen
previously.
IMPRESSION: Interval appendectomy. No complicating feature evident by CT. No
evidence of regional abscess or contrast leakage. No free air or
fluid.

Moderate amount of fecal matter within the colon.

Follicular cysts of the right ovary, the larger measuring 2.1 cm,
normal in a premenopausal female. No follow-up imaging recommended.
Note: This recommendation does not apply to premenarchal patients
and to those with increased risk (genetic, family history, elevated
tumor markers or other high-risk factors) of ovarian cancer.
Reference: JACR [DATE]):[id number]

## 2022-06-04 ENCOUNTER — Ambulatory Visit (INDEPENDENT_AMBULATORY_CARE_PROVIDER_SITE_OTHER): Payer: BC Managed Care – PPO | Admitting: Podiatry

## 2022-06-04 DIAGNOSIS — B351 Tinea unguium: Secondary | ICD-10-CM

## 2022-06-04 DIAGNOSIS — S90229A Contusion of unspecified lesser toe(s) with damage to nail, initial encounter: Secondary | ICD-10-CM | POA: Diagnosis not present

## 2022-06-04 NOTE — Progress Notes (Signed)
Subjective:   Patient ID: Karen [name], female   DOB: 51 y.o.   MRN: [id number]   HPI Chief Complaint  Patient presents with   Nail Problem    Patient came in today for a nail fungus bilateral hallux, with a possible ingrown on the left hallux,     51 year old female presents for above concerns.  Had an ingrown toenail removal then 2 months after the nail came off. She went to get a fake nail was removed and that she has the nail is dark.  No drainage or pus.  No recent treatment.  No injuries.   Review of Systems  All other systems reviewed and are negative.  Past Medical History:  Diagnosis Date   Anemia    Lipoma    4-5 cm lipoma right upper quadrant (abdomen)   Osteopenia    Urinary, incontinence, stress female    Varicose veins     Past Surgical History:  Procedure Laterality Date   APPENDECTOMY     CESAREAN SECTION  2003, 2006   CHOLECYSTECTOMY  07/03/2003   ENDOVENOUS ABLATION SAPHENOUS VEIN W/ LASER  08/07/2012   left greater saphenous vein and stab phlebectomy 10-20 incisions left leg  by Curt Jews MD   LAPAROSCOPIC APPENDECTOMY N/A 08/20/2021   Procedure: APPENDECTOMY LAPAROSCOPIC;  Surgeon: Jesusita Oka, MD;  Location: [facility];  Service: General;  Laterality: N/A;     Current Outpatient Medications:    acetaminophen (TYLENOL) 500 MG tablet, Take 1,000 mg by mouth every 6 (six) hours as needed for moderate pain or headache. (Patient not taking: Reported on 06/04/2022), Disp: , Rfl:    acetaminophen (TYLENOL) 500 MG tablet, Take 2 tablets (1,000 mg total) by mouth every 6 (six) hours as needed., Disp: 120 tablet, Rfl: 1   Cholecalciferol (VITAMIN D-3) 125 MCG (5000 UT) TABS, Take 5,000 Units by mouth daily. (Patient not taking: Reported on 06/04/2022), Disp: , Rfl:    Cyanocobalamin (VITAMIN B-12 CR PO), Take 1 tablet by mouth daily. (Patient not taking: Reported on 06/04/2022), Disp: , Rfl:    dicyclomine (BENTYL) 20 MG tablet, Take 1 tablet (20 mg total) by  mouth 2 (two) times daily as needed for spasms (abdominal cramping, spasms). (Patient not taking: Reported on 06/04/2022), Disp: 20 tablet, Rfl: 0   docusate sodium (COLACE) 100 MG capsule, Take 1 capsule (100 mg total) by mouth 2 (two) times daily. (Patient not taking: Reported on 06/04/2022), Disp: 60 capsule, Rfl: 2   ibuprofen (ADVIL) 200 MG tablet, Take 600 mg by mouth every 6 (six) hours as needed for headache or moderate pain., Disp: , Rfl:    ibuprofen (ADVIL) 600 MG tablet, Take 1 tablet (600 mg total) by mouth 4 (four) times daily. (Patient not taking: Reported on 06/04/2022), Disp: 120 tablet, Rfl: 1   magnesium gluconate (MAGONATE) 500 MG tablet, Take 500-1,000 mg by mouth daily. (Patient not taking: Reported on 06/04/2022), Disp: , Rfl:    meloxicam (MOBIC) 15 MG tablet, Take 15 mg by mouth daily. (Patient not taking: Reported on 08/19/2021), Disp: , Rfl:    methocarbamol (ROBAXIN-750) 750 MG tablet, Take 1 tablet (750 mg total) by mouth 4 (four) times daily. (Patient not taking: Reported on 06/04/2022), Disp: 120 tablet, Rfl: 1   Multiple Vitamins-Minerals (ONE-A-DAY WOMENS PO), Take 1 tablet by mouth daily., Disp: , Rfl:    nortriptyline (PAMELOR) 10 MG capsule, Take 1 capsule (10 mg total) by mouth at bedtime. (Patient not taking: Reported on 08/19/2021), Disp:  30 capsule, Rfl: 11   oxyCODONE (ROXICODONE) 5 MG immediate release tablet, Take 1 tablet (5 mg total) by mouth every 4 (four) hours as needed. (Patient not taking: Reported on 06/04/2022), Disp: 30 tablet, Rfl: 0   polyethylene glycol (MIRALAX) 17 g packet, Take 17 g by mouth daily. (Patient not taking: Reported on 06/04/2022), Disp: 14 each, Rfl: 0   SELENIUM PO, Take 1 tablet by mouth daily., Disp: , Rfl:  No current facility-administered medications for this visit.  Facility-Administered Medications Ordered in Other Visits:    gadopentetate dimeglumine (MAGNEVIST) injection 11 mL, 11 mL, Intravenous, Once PRN, Sater, Nanine Means,  MD  Allergies  Allergen Reactions   Penicillins           Objective:  Physical Exam  General: AAO x3, NAD  Dermatological: On hallux toenail is dystrophic, hypertrophic and there is some dried blood present.  The dark discoloration is able debride this and appear to be dried blood.  Mild incurvation of nail without any edema, erythema or signs of infection noted today.  No drainage or pus.   Vascular: Dorsalis Pedis artery and Posterior Tibial artery pedal pulses are 2/4 bilateral with immedate capillary fill time. There is no pain with calf compression, swelling, warmth, erythema.   Neruologic: Grossly intact via light touch bilateral.   Musculoskeletal: No gross boney pedal deformities bilateral. No pain, crepitus, or limitation noted with foot and ankle range of motion bilateral. Muscular strength 5/5 in all groups tested bilateral.  Gait: Unassisted, Nonantalgic.       Assessment:   Toenail dark discoloration likely subungual hematoma/trauma from nail     Plan:  -Treatment options discussed including all alternatives, risks, and complications -Etiology of symptoms were discussed -Continue to have dark discoloration I did sharply debride this and sent for culture, pathology.  I do think this is dried blood however we will do further biopsy to rule out any other causes.  Will check the results back on this as well to help with fungus.  Discussed different treatment options.  We discussed soaking Epsom salts.  Monitor for signs or symptoms of infection.  If needed may need to remove the entire toenail.  Trula Slade DPM

## 2022-06-04 NOTE — Patient Instructions (Signed)

## 2022-06-10 ENCOUNTER — Encounter: Payer: Self-pay | Admitting: Podiatry

## 2022-06-11 NOTE — Telephone Encounter (Signed)
Denny Peon, can you check this week if the results are back. Thanks

## 2022-06-15 ENCOUNTER — Other Ambulatory Visit: Payer: Self-pay | Admitting: Podiatry

## 2022-06-15 MED ORDER — EFINACONAZOLE 10 % EX SOLN: 1.0000 [drp] | Freq: Every day | CUTANEOUS | 11 refills | Status: DC

## 2022-09-06 ENCOUNTER — Ambulatory Visit: Payer: BC Managed Care – PPO | Admitting: Podiatry

## 2022-09-17 ENCOUNTER — Ambulatory Visit (INDEPENDENT_AMBULATORY_CARE_PROVIDER_SITE_OTHER): Payer: BC Managed Care – PPO | Admitting: Podiatry

## 2022-09-17 DIAGNOSIS — B351 Tinea unguium: Secondary | ICD-10-CM | POA: Diagnosis not present

## 2022-09-17 NOTE — Progress Notes (Unsigned)
Subjective: Chief Complaint  Patient presents with   Foot Problem    Bilateral great hallux nails, On right foot patient had ingrown procedure done and soon afternail fell off, nail is growing back but patient wants to make sure nail is growing back properly, On left toe, an artificial nail was applied to the great toe and now the nail is affected.     52 year old female presents the office today for evaluation of her big toenails.  She has been using topical medication but she wants to make sure that they are doing okay.  She does not have any pain.  No swelling redness or drainage.  No open lesions.  No other concerns.  Objective: AAO x3, NAD DP/PT pulses palpable bilaterally, CRT less than 3 seconds On the right hallux nail.  There is new healthy nail growing in an old nail starting the left.  On the right side there is clearing on the proximal nail fold as well.  There is no edema, erythema or signs of infection.  See picture below.  No open lesions. No pain with calf compression, swelling, warmth, erythema    Assessment: Onychomycosis  Plan: -All treatment options discussed with the patient including all alternatives, risks, complications.  -Female seen to be growing out.  Continue topical medication.  The next refill will try to send to "your rx pharmacy" in [place] to see if it is any cheaper for her.  -Monitor for any clinical signs or symptoms of infection and directed to call the office immediately should any occur or go to the ER. -Patient encouraged to call the office with any questions, concerns, change in symptoms.   Return in about 6 months (around 03/20/2023), or if symptoms worsen or fail to improve, for nail fungus .  Trula Slade DPM

## 2022-10-29 ENCOUNTER — Encounter: Payer: Self-pay | Admitting: Podiatry

## 2023-01-17 ENCOUNTER — Other Ambulatory Visit (HOSPITAL_COMMUNITY): Payer: Self-pay | Admitting: Gastroenterology

## 2023-01-17 DIAGNOSIS — R1310 Dysphagia, unspecified: Secondary | ICD-10-CM

## 2023-01-24 ENCOUNTER — Ambulatory Visit (HOSPITAL_COMMUNITY)
Admission: RE | Admit: 2023-01-24 | Discharge: 2023-01-24 | Disposition: A | Discharge status: Home / Self Care | Payer: BC Managed Care – PPO | Source: Ambulatory Visit | Attending: Gastroenterology | Admitting: Gastroenterology

## 2023-01-24 DIAGNOSIS — R1310 Dysphagia, unspecified: Secondary | ICD-10-CM | POA: Insufficient documentation

## 2023-04-02 ENCOUNTER — Ambulatory Visit (INDEPENDENT_AMBULATORY_CARE_PROVIDER_SITE_OTHER): Payer: BC Managed Care – PPO | Admitting: Podiatry

## 2023-04-02 DIAGNOSIS — B351 Tinea unguium: Secondary | ICD-10-CM | POA: Diagnosis not present

## 2023-04-02 DIAGNOSIS — L603 Nail dystrophy: Secondary | ICD-10-CM

## 2023-04-02 NOTE — Patient Instructions (Signed)

## 2023-04-02 NOTE — Progress Notes (Unsigned)
Subjective: No chief complaint on file.  52 year old female presents the office today for concerns of her nail.  She states left side she put a fake nail on and when she took it off the nail started to grow weird she states that started to hurt some.  No drainage or pus or any swelling or redness.  She would like for me to trim the nails for her as she is scared to do it but overall the nails have been growing out and the color is much better.  He has since discontinued the topical medication.  Objective: AAO x3, NAD DP/PT pulses palpable bilaterally, CRT less than 3 seconds At the distal portion of bilateral hallux toenails there is a transverse line where it looks that there is new nail growing in the distal one third is growing out.  Distally the nail is hypertrophic, dystrophic and mildly incurvated but there is no edema, erythema or signs of infection.  No drainage or pus. No pain with calf compression, swelling, warmth, erythema  Assessment: Onychodystrophy, onychomycosis  Plan: -All treatment options discussed with the patient including all alternatives, risks, complications.  -Postoperatively the nails with any complications or bleeding.  Recommend to return back to using the topical antifungal.  Monitoring signs symptoms of infection.  If symptoms persist she may need a partial nail avulsion but we will hold off on this today. -Patient encouraged to call the office with any questions, concerns, change in symptoms.   Vivi Barrack DPM

## 2023-06-20 ENCOUNTER — Ambulatory Visit: Payer: BC Managed Care – PPO | Admitting: Podiatry

## 2023-11-11 ENCOUNTER — Telehealth: Payer: Self-pay

## 2023-11-11 NOTE — Telephone Encounter (Signed)
 Spoke with pt, pt is concerned with EKG changes recently noted with her primary care provider. Able to schedule pt to be seen sooner with Dr. Katheryne Pane.

## 2023-11-11 NOTE — Telephone Encounter (Signed)
 Left message for pt to call back for sooner appointment with Dr. Katheryne Pane.

## 2023-11-12 ENCOUNTER — Other Ambulatory Visit: Payer: Self-pay | Admitting: Cardiovascular Disease

## 2023-11-12 ENCOUNTER — Ambulatory Visit: Attending: Cardiovascular Disease | Admitting: Cardiovascular Disease

## 2023-11-12 ENCOUNTER — Ambulatory Visit

## 2023-11-12 VITALS — BP 98/60 | HR 57 | Ht 67.0 in | Wt 126.0 lb

## 2023-11-12 DIAGNOSIS — R001 Bradycardia, unspecified: Secondary | ICD-10-CM

## 2023-11-12 DIAGNOSIS — R0609 Other forms of dyspnea: Secondary | ICD-10-CM

## 2023-11-12 DIAGNOSIS — Z7689 Persons encountering health services in other specified circumstances: Secondary | ICD-10-CM | POA: Diagnosis not present

## 2023-11-12 NOTE — Assessment & Plan Note (Signed)
 Karen [name] is self-referred because of fatigue, dyspnea and bradycardia.  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  Her heart rate today is 57.  I am going to get a 2-week Zio patch to further evaluate.

## 2023-11-12 NOTE — Patient Instructions (Addendum)
 Medication Instructions:  Your physician recommends that you continue on your current medications as directed. Please refer to the Current Medication list given to you today.    *If you need a refill on your cardiac medications before your next appointment, please call your pharmacy*   Lab Work: LIPID PANEL  THYROID STIMULATING HORMONE + FREE T4 + FREE T3 HEPATIC FUNCTION PANEL      If you have labs (blood work) drawn today and your tests are completely normal, you will receive your results only by: MyChart Message (if you have MyChart) OR A paper copy in the mail If you have any lab test that is abnormal or we need to change your treatment, we will call you to review the results.   Testing/Procedures: Your physician has requested that you have an echocardiogram. Echocardiography is a painless test that uses sound waves to create images of your heart. It provides your doctor with information about the size and shape of your heart and how well your heart's chambers and valves are working. This procedure takes approximately one hour. There are no restrictions for this procedure. Please do NOT wear cologne, perfume, aftershave, or lotions (deodorant is allowed). Please arrive 15 minutes prior to your appointment time.  Please note: We ask at that you not bring children with you during ultrasound (echo/ vascular) testing. Due to room size and safety concerns, children are not allowed in the ultrasound rooms during exams. Our front office staff cannot provide observation of children in our lobby area while testing is being conducted. An adult accompanying a patient to their appointment will only be allowed in the ultrasound room at the discretion of the ultrasound technician under special circumstances. We apologize for any inconvenience.  ZIO XT- Long Term Monitor Instructions  Your physician has requested you wear a ZIO patch monitor for 14 days.  This is a single patch monitor. [employer]  supplies one patch monitor per enrollment. Additional stickers are not available. Please do not apply patch if you will be having a Nuclear Stress Test,  Echocardiogram, Cardiac CT, MRI, or Chest Xray during the period you would be wearing the  monitor. The patch cannot be worn during these tests. You cannot remove and re-apply the  ZIO XT patch monitor.  Your ZIO patch monitor will be mailed 3 day USPS to your address on file. It may take 3-5 days  to receive your monitor after you have been enrolled.  Once you have received your monitor, please review the enclosed instructions. Your monitor  has already been registered assigning a specific monitor serial # to you.  Billing and Patient Assistance Program Information  We have supplied [employer] with any of your insurance information on file for billing purposes. [employer] offers a sliding scale Patient Assistance Program for patients that do not have  insurance, or whose insurance does not completely cover the cost of the ZIO monitor.  You must apply for the Patient Assistance Program to qualify for this discounted rate.  To apply, please call [employer] at [phone], select option 4, select option 2, ask to apply for  Patient Assistance Program. Sanna Crystal will ask your household income, and how many people  are in your household. They will quote your out-of-pocket cost based on that information.  [employer] will also be able to set up a 69-month, interest-free payment plan if needed.  Applying the monitor   Shave hair from upper left chest.  Hold abrader disc by orange tab. Rub abrader in 40  strokes over the upper left chest as  indicated in your monitor instructions.  Clean area with 4 enclosed alcohol pads. Let dry.  Apply patch as indicated in monitor instructions. Patch will be placed under collarbone on left  side of chest with arrow pointing upward.  Rub patch adhesive wings for 2 minutes. Remove white label marked "1". Remove the white   label marked "2". Rub patch adhesive wings for 2 additional minutes.  While looking in a mirror, press and release button in center of patch. A small green light will  flash 3-4 times. This will be your only indicator that the monitor has been turned on.  Do not shower for the first 24 hours. You may shower after the first 24 hours.  Press the button if you feel a symptom. You will hear a small click. Record Date, Time and  Symptom in the Patient Logbook.  When you are ready to remove the patch, follow instructions on the last 2 pages of Patient  Logbook. Stick patch monitor onto the last page of Patient Logbook.  Place Patient Logbook in the blue and white box. Use locking tab on box and tape box closed  securely. The blue and white box has prepaid postage on it. Please place it in the mailbox as  soon as possible. Your physician should have your test results approximately 7 days after the  monitor has been mailed back to [facility].  Call [facility] Customer Care at [phone] if you have questions regarding  your ZIO XT patch monitor. Call them immediately if you see an orange light blinking on your  monitor.  If your monitor falls off in less than 4 days, contact our Monitor department at [phone].  If your monitor becomes loose or falls off after 4 days call [employer] at [phone] for  suggestions on securing your monitor     Dr. Katheryne Pane  has ordered a CT coronary calcium score.   Test locations:  [facility]  [facility] at [facility]  This is $99 out of pocket.   Coronary CalciumScan A coronary calcium scan is an imaging test used to look for deposits of calcium and other fatty materials (plaques) in the inner lining of the blood vessels of the heart (coronary arteries). These deposits of calcium and plaques can partly clog and narrow the coronary arteries without producing any  symptoms or warning signs. This puts a person at risk for a heart attack. This test can detect these deposits before symptoms develop. Tell a health care provider about: Any allergies you have. All medicines you are taking, including vitamins, herbs, eye drops, creams, and over-the-counter medicines. Any problems you or family members have had with anesthetic medicines. Any blood disorders you have. Any surgeries you have had. Any medical conditions you have. Whether you are pregnant or may be pregnant. What are the risks? Generally, this is a safe procedure. However, problems may occur, including: Harm to a pregnant woman and her unborn baby. This test involves the use of radiation. Radiation exposure can be dangerous to a pregnant woman and her unborn baby. If you are pregnant, you generally should not have this procedure done. Slight increase in the risk of cancer. This is because of the radiation involved in the test. What happens before the procedure? No preparation is needed for this procedure. What happens during the procedure? You will undress and remove any jewelry around your neck or chest.  You will put on a hospital gown. Sticky electrodes will be placed on your chest. The electrodes will be connected to an electrocardiogram (ECG) machine to record a tracing of the electrical activity of your heart. A CT scanner will take pictures of your heart. During this time, you will be asked to lie still and hold your breath for 2-3 seconds while a picture of your heart is being taken. The procedure may vary among health care providers and hospitals. What happens after the procedure? You can get dressed. You can return to your normal activities. It is up to you to get the results of your test. Ask your health care provider, or the department that is doing the test, when your results will be ready. Summary A coronary calcium scan is an imaging test used to look for deposits of calcium and  other fatty materials (plaques) in the inner lining of the blood vessels of the heart (coronary arteries). Generally, this is a safe procedure. Tell your health care provider if you are pregnant or may be pregnant. No preparation is needed for this procedure. A CT scanner will take pictures of your heart. You can return to your normal activities after the scan is done. This information is not intended to replace advice given to you by your health care provider. Make sure you discuss any questions you have with your health care provider. Document Released: 12/15/2007 Document Revised: 05/07/2016 Document Reviewed: 05/07/2016 Elsevier Interactive Patient Education  2017 [employer].    Follow-Up: At [facility], you and your health needs are our priority.  As part of our continuing mission to provide you with exceptional heart care, we have created designated Provider Care Teams.  These Care Teams include your primary Cardiologist (physician) and Advanced Practice Providers (APPs -  Physician Assistants and Nurse Practitioners) who all work together to provide you with the care you need, when you need it.  We recommend signing up for the patient portal called "MyChart".  Sign up information is provided on this After Visit Summary.  MyChart is used to connect with patients for Virtual Visits (Telemedicine).  Patients are able to view lab/test results, encounter notes, upcoming appointments, etc.  Non-urgent messages can be sent to your provider as well.   To learn more about what you can do with MyChart, go to [email or web address].    Your next appointment:   3 month(s)  The format for your next appointment:   In Person  Provider:   One of our Advanced Practice Providers (APPs): Melita Springer, PA-C  Friddie Jetty, NP Evaline Hill, NP  Theotis Flake, PA-C Lawana Pray, NP  Willis Harter, PA-C Lovette Rud, PA-C  [place], PA-C Ernest Dick, NP  Marlana Silvan, NP Marcie Sever,  PA-C  Laquita Plant, PA-C    Dayna Dunn, PA-C  Scott Weaver, PA-C Palmer Bobo, NP Katlyn West, NP Callie Goodrich, PA-C  Evan Williams, PA-C Sheng Haley, PA-C  Xika Zhao, NP Kathleen Johnson, PA-C    Other Instructions

## 2023-11-12 NOTE — Progress Notes (Unsigned)
 ZIO serial # [id number] from office inventory applied.

## 2023-11-12 NOTE — Assessment & Plan Note (Signed)
 1 year history of dyspnea on exertion.  She is fairly active and works out in [employer] and doing aerobic activity.  For the last year she has had progressive dyspnea.  There is no history of tobacco abuse.  I am going to get a coronary calcium score and a 2D echo to further evaluate.

## 2023-11-12 NOTE — Progress Notes (Signed)
 11/12/2023 Karen [name]   11-10-1970  [id number]  Primary Physician Alfredia Ina, MD Primary Cardiologist: Avanell Leigh MD Bennye Bravo, [place]  HPI:  Karen [name] is a 53 y.o. thin-appearing married Caucasian female mother of 4 children who is accompanied by her husband Gorman Laughter today.  She is self-referred for evaluation of bradycardia, dyspnea and fatigue.  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  She has no cardiac risk factors.  She is fairly active as a runner, Licensed conveyancer and is very attentive to her health.  Over the last year she has noticed progressive fatigue and increasing dyspnea on exertion.  There is a history of bradycardia dating back to 2005 when her heart rate was in the 20s and 30s and there was a question of whether she required a pacemaker.   Current Meds  Medication Sig   Digestive Enzymes (DIGESTIVE ENZYME PO) Take 1 tablet by mouth 2 (two) times daily. Takes 2 tablets at lunch and 2 pills at dinner   polyethylene glycol (MIRALAX ) 17 g packet Take 17 g by mouth daily.   Probiotic Product (PROBIOTIC DAILY PO) Take 2 tablets by mouth daily.   Vitamin D-Vitamin K (VITAMIN K2-VITAMIN D3 PO) Take 1 tablet by mouth 3 (three) times a week.     Allergies  Allergen Reactions   Penicillins     Social History   Socioeconomic History   Marital status: Married    Spouse name: Gorman Laughter   Number of children: 4   Years of education: Ba   Highest education level: Not on file  Occupational History   Occupation: Administration   Tobacco Use   Smoking status: Never   Smokeless tobacco: Not on file  Vaping Use   Vaping status: Never Used  Substance and Sexual Activity   Alcohol use: Yes    Comment: once weekly   Drug use: No   Sexual activity: Not on file  Other Topics Concern   Not on file  Social History Narrative   Lives at home with husband and 4 kids   Caffeine use: tea occas   Social Drivers of Health   Financial Resource  Strain: Low Risk  (07/24/2023)   Received from [facility]   Overall Financial Resource Strain (CARDIA)    Difficulty of Paying Living Expenses: Not hard at all  Food Insecurity: No Food Insecurity (07/24/2023)   Received [facility]   Hunger Vital Sign    Worried About Running Out of Food in the Last Year: Never true    Ran Out of Food in the Last Year: Never true  Transportation Needs: No Transportation Needs (07/24/2023)   Received from [facility] - Transportation    Lack of Transportation (Medical): No    Lack of Transportation (Non-Medical): No  Physical Activity: Sufficiently Active (01/05/2023)   Received from [facility]   Exercise Vital Sign    Days of Exercise per Week: 7 days    Minutes of Exercise per Session: 30 min  Stress: No Stress Concern Present (01/05/2023)   Received from [facility] - Occupational Stress Questionnaire    Feeling of Stress : Not at all  Social Connections: Socially Integrated (01/05/2023)   Received from [facility]   Social Network    How would you rate your social network (family, work, friends)?: Good participation with social networks  Intimate Partner Violence: Not At Risk (01/05/2023)  Received from [facility]   HITS    Over the last 12 months how often did your partner physically hurt you?: Never    Over the last 12 months how often did your partner insult you or talk down to you?: Never    Over the last 12 months how often did your partner threaten you with physical harm?: Never    Over the last 12 months how often did your partner scream or curse at you?: Never     Review of Systems: General: negative for chills, fever, night sweats or weight changes.  Cardiovascular: negative for chest pain, dyspnea on exertion, edema, orthopnea, palpitations, paroxysmal nocturnal dyspnea or shortness of breath Dermatological: negative for rash Respiratory: negative for cough or  wheezing Urologic: negative for hematuria Abdominal: negative for nausea, vomiting, diarrhea, bright red blood per rectum, melena, or hematemesis Neurologic: negative for visual changes, syncope, or dizziness All other systems reviewed and are otherwise negative except as noted above.    Blood pressure 98/60, pulse (!) 57, height 5\' 7"  (1.702 m), weight 126 lb (57.2 kg), SpO2 98%.    EKG EKG Interpretation Date/Time:  Tuesday Nov 12 2023 09:01:47 EDT Ventricular Rate:  53 PR Interval:  120 QRS Duration:  72 QT Interval:  430 QTC Calculation: 403 R Axis:   23  Text Interpretation: Sinus bradycardia Cannot rule out Anterior infarct , age undetermined When compared with ECG of 05-Sep-2021 15:34, PREVIOUS ECG IS PRESENT Confirmed by Lauro Portal [phone]) on 11/12/2023 9:06:24 AM    ASSESSMENT AND PLAN:   Sinus bradycardia Ms. Saddler is self-referred because of fatigue, dyspnea and bradycardia.  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  Her heart rate today is 57.  I am going to get a 2-week Zio patch to further evaluate.  Dyspnea on exertion 1 year history of dyspnea on exertion.  She is fairly active and works out in [employer] and doing aerobic activity.  For the last year she has had progressive dyspnea.  There is no history of tobacco abuse.  I am going to get a coronary calcium score and a 2D echo to further evaluate.     Avanell Leigh MD FACP,FACC,FAHA, [facility] 11/12/2023 9:28 AM

## 2023-11-12 NOTE — Addendum Note (Signed)
 Addended by: Zeb Heys on: 11/12/2023 09:54 AM   Modules accepted: Orders

## 2023-11-14 ENCOUNTER — Ambulatory Visit: Payer: Self-pay | Admitting: Cardiovascular Disease

## 2023-11-14 LAB — LIPID PANEL
Chol/HDL Ratio: 3.9 ratio (ref 0.0–4.4)
Cholesterol, Total: 189 mg/dL (ref 100–199)
HDL: 49 mg/dL (ref >39)
LDL Chol Calc (NIH): 126 mg/dL — ABNORMAL HIGH (ref 0–99)
Triglycerides: 73 mg/dL (ref 0–149)
VLDL Cholesterol Cal: 14 mg/dL (ref 5–40)

## 2023-11-14 LAB — HEPATIC FUNCTION PANEL
ALT: 17 IU/L (ref 0–32)
AST: 18 IU/L (ref 0–40)
Albumin: 4.4 g/dL (ref 3.8–4.9)
Alkaline Phosphatase: 52 IU/L (ref 44–121)
Bilirubin Total: 0.5 mg/dL (ref 0.0–1.2)
Bilirubin, Direct: 0.14 mg/dL (ref 0.00–0.40)
Total Protein: 6.6 g/dL (ref 6.0–8.5)

## 2023-11-14 LAB — TSH+T4F+T3FREE
Free T4: 1.08 ng/dL (ref 0.82–1.77)
T3, Free: 2.5 pg/mL (ref 2.0–4.4)
TSH: 2.12 u[IU]/mL (ref 0.450–4.500)

## 2023-12-08 ENCOUNTER — Ambulatory Visit: Payer: Self-pay | Admitting: Cardiovascular Disease

## 2023-12-08 DIAGNOSIS — R001 Bradycardia, unspecified: Secondary | ICD-10-CM | POA: Diagnosis not present

## 2023-12-08 DIAGNOSIS — R0609 Other forms of dyspnea: Secondary | ICD-10-CM

## 2023-12-16 ENCOUNTER — Ambulatory Visit (HOSPITAL_COMMUNITY)
Admission: RE | Admit: 2023-12-16 | Discharge: 2023-12-16 | Disposition: A | Discharge status: Home / Self Care | Source: Ambulatory Visit | Attending: Cardiology | Admitting: Cardiology

## 2023-12-16 DIAGNOSIS — R0609 Other forms of dyspnea: Secondary | ICD-10-CM | POA: Diagnosis present

## 2023-12-16 LAB — ECHOCARDIOGRAM COMPLETE
Area-P 1/2: 3.59 cm2
S' Lateral: 2.8 cm

## 2023-12-19 ENCOUNTER — Encounter: Payer: Self-pay | Admitting: Cardiovascular Disease

## 2023-12-26 ENCOUNTER — Ambulatory Visit (HOSPITAL_BASED_OUTPATIENT_CLINIC_OR_DEPARTMENT_OTHER)
Admission: RE | Admit: 2023-12-26 | Discharge: 2023-12-26 | Disposition: A | Discharge status: Home / Self Care | Payer: Self-pay | Source: Ambulatory Visit | Attending: Cardiovascular Disease | Admitting: Cardiovascular Disease

## 2023-12-26 DIAGNOSIS — R001 Bradycardia, unspecified: Secondary | ICD-10-CM | POA: Insufficient documentation

## 2024-01-16 ENCOUNTER — Encounter: Payer: Self-pay | Admitting: Podiatry

## 2024-01-20 ENCOUNTER — Other Ambulatory Visit: Payer: Self-pay | Admitting: Podiatry

## 2024-01-20 DIAGNOSIS — L609 Nail disorder, unspecified: Secondary | ICD-10-CM

## 2024-01-26 ENCOUNTER — Other Ambulatory Visit: Payer: Self-pay | Admitting: Medical Genetics

## 2024-01-30 ENCOUNTER — Other Ambulatory Visit: Payer: Self-pay | Admitting: Gastroenterology

## 2024-01-30 DIAGNOSIS — R1033 Periumbilical pain: Secondary | ICD-10-CM

## 2024-01-31 ENCOUNTER — Ambulatory Visit
Admission: RE | Admit: 2024-01-31 | Discharge: 2024-01-31 | Disposition: A | Discharge status: Home / Self Care | Source: Ambulatory Visit | Attending: Gastroenterology | Admitting: Gastroenterology

## 2024-01-31 DIAGNOSIS — R1033 Periumbilical pain: Secondary | ICD-10-CM

## 2024-01-31 MED ORDER — IOPAMIDOL (ISOVUE-300) INJECTION 61%
80.0000 mL | Freq: Once | INTRAVENOUS | Status: AC | PRN
  Administered 2024-01-31: 80 mL via INTRAVENOUS

## 2024-02-09 NOTE — Progress Notes (Deleted)
 Cardiology Office Note:    Date:  02/09/2024   ID:  Karen [name], DOB Dec 09, 1970, MRN [id number]  PCP:  Pura Lenis, MD  Cardiologist:  None { Click to update primary MD,subspecialty MD or APP then REFRESH:1}    Referring MD: Pura Lenis, MD   Chief Complaint: follow-up of fatigue, dyspnea, and bradycardia   History of Present Illness:    Karen [name] is a 53 y.o. female with a history of coronary calcium score of 0 in 12/2023,  varicose veins, anemia, and osteopenia who is followed by Dr. Court and presents today for follow-up of fatigue, dyspnea, and bradycardia.   Patient was recently referred to Dr. Court in 10/2023 for further evaluation of progressive fatigue and dyspnea on exertion over the last year. She also reported a history of bradycardia dating back to 2005 with her heart rate as low as the 20s to 30s at times.  Thyroid function tests were normal. Echo, 2 week Zio monitor, and a coronary calcium score were ordered for further evaluation. Ech showed LVEF of 60-65% with normal wall motion and diastolic parameters, normal RV function, and no significant valvular disease. Monitor showed predominant sinus rhythm with average rate of 56 bpm (min 35, max 151) and occasional PACs/ PVCs. Coronary calcium score was 0.   Patient presents today for follow-up. ***  Fatigue  Dyspnea on Exertion Patient reported progressive fatigue and dyspnea on exertion at visit in 10/2023. Thyroid function tests were normal. Echo showed normal LV function with no significant valvular disease. Coronary calcium score was 0.  - ***  Bradycardia Patient reported bradycardia dating back to 2005 with heart rates as low as the 20s to 30s at times. Monitor in 10/2023 showed predominant sinus rhythm with average rate of 56 bpm (min 35, max 151) and occasional PACs/ PVCs but no high grade AV block or significant pauses.  - ***  EKGs/Labs/Other Studies Reviewed:    The following studies were  reviewed:  Monitor 2023/11/24 to 11/26/2023: Patient had a min HR of 35 bpm, max HR of 151 bpm, and avg HR of 56 bpm. Predominant underlying rhythm was Sinus Rhythm. Slight P wave morphology changes were noted. Isolated SVEs were rare (<1.0%), SVE Couplets were rare (<1.0%), and no SVE Triplets  were present. Isolated VEs were rare (<1.0%), and no VE Couplets or VE Triplets were present. Ventricular Bigeminy and Trigeminy were present.   SR/SB/ST Occasional PACs/PVCs _______________  Echocardiogram 12/16/2023: Impressions: 1. Left ventricular ejection fraction, by estimation, is 60 to 65%. The  left ventricle has normal function. The left ventricle has no regional  wall motion abnormalities. Left ventricular diastolic parameters were  normal. The average left ventricular  global longitudinal strain is -29.1 %. The global longitudinal strain is  normal.   2. Right ventricular systolic function is normal. The right ventricular  size is normal.   3. The mitral valve is normal in structure. Trivial mitral valve  regurgitation. No evidence of mitral stenosis.   4. The aortic valve is tricuspid. Aortic valve regurgitation is not  visualized. Aortic valve sclerosis is present, with no evidence of aortic  valve stenosis.   5. The inferior vena cava is dilated in size with >50% respiratory  variability, suggesting right atrial pressure of 8 mmHg.  _______________  CT Cardiac Scoring 12/26/2023: Impression: Coronary calcium score of 0.   EKG:  EKG not ordered today.   Recent Labs: 11/13/2023: ALT 17; TSH 2.120  Recent Lipid Panel  Component Value Date/Time   CHOL 189 11/13/2023 0907   TRIG 73 11/13/2023 0907   HDL 49 11/13/2023 0907   CHOLHDL 3.9 11/13/2023 0907   LDLCALC 126 (H) 11/13/2023 0907    Physical Exam:    Vital Signs: There were no vitals taken for this visit.    Wt Readings from Last 3 Encounters:  11/12/23 126 lb (57.2 kg)  09/05/21 130 lb (59 kg)  08/20/21 130  lb (59 kg)     General: 53 y.o. female in no acute distress. HEENT: Normocephalic and atraumatic. Sclera clear.  Neck: Supple. No carotid bruits. No JVD. Heart: *** RRR. Distinct S1 and S2. No murmurs, gallops, or rubs.  Lungs: No increased work of breathing. Clear to ausculation bilaterally. No wheezes, rhonchi, or rales.  Abdomen: Soft, non-distended, and non-tender to palpation.  Extremities: No lower extremity edema.  Radial and distal pedal pulses 2+ and equal bilaterally. Skin: Warm and dry. Neuro: No focal deficits. Psych: Normal affect. Responds appropriately.   Assessment:    No diagnosis found.  Plan:     Disposition: Follow up in ***   Signed, Aline FORBES Door, PA-C  02/09/2024 1:44 PM    [facility]

## 2024-02-18 NOTE — Progress Notes (Unsigned)
 Cardiology Office Note:    Date:  02/19/2024   ID:  Karen, [name] Feb 19, 1971, MRN [id number]  PCP:  Karen Lenis, MD   [facility] Providers Cardiologist:  Karen Lesches, MD     Referring MD: Karen Lenis, MD   Chief complaint: 91-month follow-up  History of Present Illness:    Karen [name] is a 53 y.o. female with a hx of self reported bradycardia, no other significant cardiac disease. Was referred to Dr. Lesches for fatigue, bradycardia, and dyspnea, evaluated on 11/12/2023.    Zio patch was worn for 13 days and 23 hours, patient had a minimum heart rate of 35 bpm, max heart rate of 151 bpm, and average heart rate of 56 bpm.  Predominant underlying rhythm was sinus rhythm.  Slight P wave morphology changes were noted.  Isolated SVE's were rare (<1.0%), SVE couplets were rare (<1.0%), and no SVE triplets were present.  Isolated VE's were rare (<1.0%), and no VE couplets or VE triplets were present.  Ventricular bigeminy and trigeminy were present.  Echo on 12/16/2023 showed LVEF 60-65%, normal LV function, no RWMA, normal diastolic parameters, normal RV, trivial mitral valve regurg, aortic valve sclerosis present, with no evidence of aortic valve stenosis.  Aortic root normal in size and structure, IVC dilated with greater than 50% respiratory variability, suggesting right atrial pressure of 8 mmHg, no atrial shunt.  Coronary calcium score of 0 on 12/26/2023.   Karen [name] presents the office today alone, discussed results of echo, Zio patch, coronary calcium score findings.  DOE unchanged since prior visit.  Patient began eating red meat over the last week, states that is improving some of her fatigue.  Denies chest pain, palpitations, dizziness, lightheaded, near syncope, nausea, dark/bloody stools, leg swelling.  Patient appears thin, physically fit, patient states she is active and works out at [employer] regularly, no history of smoking, no family history of major cardiac events.   Reports she snoring occasionally, denies known apneic episodes, does have extended family with history of sleep apnea.  Patient not currently interested in a sleep study at this time.  Patient concerned about an elevated LDL of 126, and is concerned that red meat may increase the value. Went over ASCVD risk stratification system with patient, explained why statin was not recommended/indicated at previous visit.  Discussed the possibility of performing a stress test, patient decided not to pursue at this time. I feel this is reasonable given her lack of risk factors, 0.9% of MACE over the next 10 years, and lack of chest pain or palpitations.   Past Medical History:  Diagnosis Date   Anemia    Lipoma    4-5 cm lipoma right upper quadrant (abdomen)   Osteopenia    Urinary, incontinence, stress female    Varicose veins     Past Surgical History:  Procedure Laterality Date   APPENDECTOMY     CESAREAN SECTION  2003, 2006   CHOLECYSTECTOMY  07/03/2003   ENDOVENOUS ABLATION SAPHENOUS VEIN W/ LASER  08/07/2012   left greater saphenous vein and stab phlebectomy 10-20 incisions left leg  by Karen Doing MD   LAPAROSCOPIC APPENDECTOMY N/A 08/20/2021   Procedure: APPENDECTOMY LAPAROSCOPIC;  Surgeon: Karen Dreama SAILOR, MD;  Location: [facility] OR;  Service: General;  Laterality: N/A;    Current Medications: Current Meds  Medication Sig   Digestive Enzymes (DIGESTIVE ENZYME PO) Take 1 tablet by mouth 2 (two) times daily. Takes 2 tablets at lunch and 2 pills  at dinner   polyethylene glycol (MIRALAX ) 17 g packet Take 17 g by mouth daily.   Probiotic Product (PROBIOTIC DAILY PO) Take 2 tablets by mouth daily.   Vitamin D-Vitamin K (VITAMIN K2-VITAMIN D3 PO) Take 1 tablet by mouth 3 (three) times a week.     Allergies:   Penicillins   Social History   Socioeconomic History   Marital status: Married    Spouse name: Karen [name]   Number of children: 4   Years of education: Ba   Highest education level: Not on  file  Occupational History   Occupation: Administration   Tobacco Use   Smoking status: Never   Smokeless tobacco: Not on file  Vaping Use   Vaping status: Never Used  Substance and Sexual Activity   Alcohol use: Yes    Comment: once weekly   Drug use: No   Sexual activity: Not on file  Other Topics Concern   Not on file  Social History Narrative   Lives at home with husband and 4 kids   Caffeine use: tea occas   Social Drivers of Health   Financial Resource Strain: Low Risk  (07/24/2023)   Received from [facility]   Overall Financial Resource Strain (CARDIA)    Difficulty of Paying Living Expenses: Not hard at all  Food Insecurity: No Food Insecurity (07/24/2023)   Received [facility]   Hunger Vital Sign    Within the past 12 months, you worried that your food would run out before you got the money to buy more.: Never true    Within the past 12 months, the food you bought just didn't last and you didn't have money to get more.: Never true  Transportation Needs: No Transportation Needs (07/24/2023)   Received from [facility] Transportation    Lack of Transportation (Medical): No    Lack of Transportation (Non-Medical): No  Physical Activity: Sufficiently Active (01/05/2023)   Received from [facility]   Exercise Vital Sign    On average, how many days per week do you engage in moderate to strenuous exercise (like a brisk walk)?: 7 days    On average, how many minutes do you engage in exercise at this level?: 30 min  Stress: No Stress Concern Present (01/05/2023)   Received from [facility] - Occupational Stress Questionnaire    Feeling of Stress : Not at all  Social Connections: Socially Integrated (01/05/2023)   Received from [facility]   Social Network    How would you rate your social network (family, work, friends)?: Good participation with social networks     Family History: The patient's family  history is not on file.  ROS:   Please see the history of present illness.    All other systems reviewed and are negative.  EKGs/Labs/Other Studies Reviewed:    The following studies were reviewed today:      Recent Labs: 11/13/2023: ALT 17; TSH 2.120  Recent Lipid Panel    Component Value Date/Time   CHOL 189 11/13/2023 0907   TRIG 73 11/13/2023 0907   HDL 49 11/13/2023 0907   CHOLHDL 3.9 11/13/2023 0907   LDLCALC 126 (H) 11/13/2023 0907    CBC  [facility]05/01/2024 Component 11/07/2023 01/08/2023 10/30/2021       WBC 6 5.3 5.0  RBC 4.48 4.35 4.07  HGB 14.1 13.6 12.6  Hematocrit -- -- 36.4  MCV 93.5 93.1 89.4  MCH  31.5 31.3 31.0  MCHC 33.7 33.6 34.7  RDW -- -- 13.6  Platelet Count -- -- 172  MPV -- -- --  Hemoglobin -- -- --  HCT 41.9 40.5 --  Plt Ct 156 167 --  RDW CV 13.6     NEUTROPHIL % 55.2 47.7  LYMPHOCYTE % 37.5 44.9  ABSOLUTE NEUTROPHIL COUNT 3.3 2.50  ABSOLUTE LYMPHOCYTE COUNT 2.2 2.30   CHEM PROFILE  [facility]05/01/2024 Component 11/07/2023 01/08/2023 10/30/2021 09/20/2021        BUN 18 15 14 10   Creatinine 0.77 0.77 0.75 0.81  eGFR If NonAfrican American -- -- -- --  eGFR If African American -- -- -- --  BUN/Creatinine Ratio 23 19 19 12   Sodium 138 139 140 141  Potassium 4.9 5 4.2 4.3  Chloride 103 102 104 104  CO2 22 25 23 22   Calcium -- -- -- --  Total Protein 6.9 6.5 6.9 6.7  Albumin -- -- -- --  Globulin, Total 2.5 2.1 2.3 2.0  Albumin/Globulin Ratio -- -- 2.0 2.4 High     Total Bilirubin 0.3 0.3 0.4 0.4  Alkaline Phosphatase 59 49 50 49  AST 17 20 20 17   ALT (SGPT) 16 22 14 18   CALCIUM 9.5 9.9 9.6 9.7  Albumin, Serum 4.4 4.4 4.6 4.7  Glom Filt Rate, Est -- -- -- --  eGFR, African American -- -- -- --  eGFR 93 94 98 89  Glucose 86 85 93 86     ASCVD (Atherosclerotic Cardiovascular Disease) 2013 Risk Calculator from AHA/ACC from [email or web address]  on 02/19/2024 ** All calculations should be rechecked by clinician prior to  use **  RESULT SUMMARY:   No statin recommended because 10-year risk <5%; always encourage healthy cardiovascular lifestyle choices. Some patients with other high risk features may still be appropriate for treatment.  0.9%   Risk of cardiovascular event (coronary or stroke death or non-fatal MI or stroke) in next 10 years.  0.9%   10-year cardiovascular risk if risk factors were optimal.   INPUTS: Age --> 52 years Diabetes --> 0 = No Sex --> 0 = Female Smoker --> 0 = No Total cholesterol --> 189 mg/dL HDL cholesterol --> 49 mg/dL Systolic blood pressure --> 102 mm Hg Treatment for hypertension --> 0 = No Race --> 3 = Other   Physical Exam:    VS:  BP 102/64 (BP Location: Left Arm, Patient Position: Sitting, Cuff Size: Normal)   Pulse (!) 53   Ht 5' 7 (1.702 m)   Wt 123 lb (55.8 kg)   BMI 19.26 kg/m     Wt Readings from Last 3 Encounters:  02/19/24 123 lb (55.8 kg)  11/12/23 126 lb (57.2 kg)  09/05/21 130 lb (59 kg)     GEN:  Well nourished, well developed in no acute distress HEENT: Normal NECK: No carotid bruits CARDIAC: S1-S2 normal, RRR, no murmurs, rubs, gallops, radial/PT 2+ RESPIRATORY:  Clear to auscultation without rales, wheezing or rhonchi  MUSCULOSKELETAL:  No edema; No deformity  SKIN: Warm and dry NEUROLOGIC:  Alert and oriented x 3 PSYCHIATRIC:  Normal affect   ASSESSMENT:    1. DOE (dyspnea on exertion)   2. Other fatigue   3. Sinus bradycardia    PLAN:    In order of problems listed above:  Dyspnea on exertion - Echo with mild aortic sclerosis, otherwise normal. Findings do not explain dyspnea - No S/S of CHF, murmurs, patient euvolemic on exam - Zio patch  rules out arrhythmia or large ectopy burden as probable cause - ASCVD risk score of 0.9%  - Discussed risks and benefits of stress testing, patient declined at this time - Discussed possible pulmonary etiology of symptoms, if symptoms worsen or do not improve, could consider work up  with pulmonology moving forward.  Fatigue  - Hemoglobin 14.1 on 11/07/2023 - Reports she snores occasionally, denies known apneic episodes, does have extended family with history of sleep apnea - Patient does not wish to pursue sleep study at this time  Bradycardia - Zio patch was worn for 13 days and 23 hours, patient had a minimum heart rate of 35 bpm, max heart rate of 151 bpm, and average heart rate of 56 bpm.  Predominant underlying rhythm was sinus rhythm. Occasional PACs and PVCs present. - Although the resting heart rate averages in the 50s, this degree of bradycardia is not clinically significant and unlikely to account for reported symptoms   Follow up with Dr. Court in 1 year, sooner if new, worsening, or concerning symptoms develop   Medication Adjustments/Labs and Tests Ordered: Current medicines are reviewed at length with the patient today.  Concerns regarding medicines are outlined above.  No orders of the defined types were placed in this encounter.  No orders of the defined types were placed in this encounter.   Patient Instructions  Medication Instructions:   Your physician recommends that you continue on your current medications as directed. Please refer to the Current Medication list given to you today.   *If you need a refill on your cardiac medications before your next appointment, please call your pharmacy*   Lab Work: NONE ORDERED  TODAY     If you have labs (blood work) drawn today and your tests are completely normal, you will receive your results only by: MyChart Message (if you have MyChart) OR A paper copy in the mail If you have any lab test that is abnormal or we need to change your treatment, we will call you to review the results.    Testing/Procedures: NONE ORDERED  TODAY      Follow-Up: At [facility], you and your health needs are our priority.  As part of our continuing mission to provide you with exceptional heart care,  our providers are all part of one team.  This team includes your primary Cardiologist (physician) and Advanced Practice Providers or APPs (Physician Assistants and Nurse Practitioners) who all work together to provide you with the care you need, when you need it.  Your next appointment:     1 year(s)   Provider:     Dorn Court, MD     We recommend signing up for the patient portal called MyChart.  Sign up information is provided on this After Visit Summary.  MyChart is used to connect with patients for Virtual Visits (Telemedicine).  Patients are able to view lab/test results, encounter notes, upcoming appointments, etc.  Non-urgent messages can be sent to your provider as well.   To learn more about what you can do with MyChart, go to [email or web address].    Other Instructions         Signed, Miriam FORBES Shams, NP  02/19/2024 6:05 PM    [facility]

## 2024-02-19 ENCOUNTER — Encounter: Payer: Self-pay | Admitting: Student

## 2024-02-19 ENCOUNTER — Ambulatory Visit: Attending: Cardiology | Admitting: Emergency Medicine

## 2024-02-19 VITALS — BP 102/64 | HR 53 | Ht 67.0 in | Wt 123.0 lb

## 2024-02-19 DIAGNOSIS — R5383 Other fatigue: Secondary | ICD-10-CM | POA: Diagnosis not present

## 2024-02-19 DIAGNOSIS — R0609 Other forms of dyspnea: Secondary | ICD-10-CM | POA: Diagnosis not present

## 2024-02-19 DIAGNOSIS — R001 Bradycardia, unspecified: Secondary | ICD-10-CM

## 2024-02-19 NOTE — Patient Instructions (Signed)
 Medication Instructions:   Your physician recommends that you continue on your current medications as directed. Please refer to the Current Medication list given to you today.   *If you need a refill on your cardiac medications before your next appointment, please call your pharmacy*   Lab Work: NONE ORDERED  TODAY     If you have labs (blood work) drawn today and your tests are completely normal, you will receive your results only by: MyChart Message (if you have MyChart) OR A paper copy in the mail If you have any lab test that is abnormal or we need to change your treatment, we will call you to review the results.    Testing/Procedures: NONE ORDERED  TODAY      Follow-Up: At [facility], you and your health needs are our priority.  As part of our continuing mission to provide you with exceptional heart care, our providers are all part of one team.  This team includes your primary Cardiologist (physician) and Advanced Practice Providers or APPs (Physician Assistants and Nurse Practitioners) who all work together to provide you with the care you need, when you need it.  Your next appointment:     1 year(s)   Provider:     Dorn Lesches, MD     We recommend signing up for the patient portal called MyChart.  Sign up information is provided on this After Visit Summary.  MyChart is used to connect with patients for Virtual Visits (Telemedicine).  Patients are able to view lab/test results, encounter notes, upcoming appointments, etc.  Non-urgent messages can be sent to your provider as well.   To learn more about what you can do with MyChart, go to [email or web address].    Other Instructions

## 2024-03-09 ENCOUNTER — Ambulatory Visit: Admitting: Cardiovascular Disease

## 2024-03-26 ENCOUNTER — Ambulatory Visit: Admitting: Podiatry

## 2024-04-08 ENCOUNTER — Ambulatory Visit: Admitting: Physician Assistant

## 2024-04-21 ENCOUNTER — Other Ambulatory Visit: Payer: Self-pay | Admitting: Medical Genetics

## 2024-04-21 DIAGNOSIS — Z006 Encounter for examination for normal comparison and control in clinical research program: Secondary | ICD-10-CM

## 2024-11-09 ENCOUNTER — Ambulatory Visit: Admitting: Physician Assistant
